# Patient Record
Sex: Male | Born: 1985 | Race: White | Hispanic: No | Marital: Single | Smoking: Current every day smoker
Health system: Southern US, Community
[De-identification: ages and names within clinical notes are randomized; demographics above are authoritative.]

## PROBLEM LIST (undated history)

## (undated) DIAGNOSIS — I498 Other specified cardiac arrhythmias: Secondary | ICD-10-CM

## (undated) DIAGNOSIS — R030 Elevated blood-pressure reading, without diagnosis of hypertension: Secondary | ICD-10-CM

## (undated) DIAGNOSIS — K649 Unspecified hemorrhoids: Secondary | ICD-10-CM

## (undated) DIAGNOSIS — J189 Pneumonia, unspecified organism: Secondary | ICD-10-CM

## (undated) DIAGNOSIS — K219 Gastro-esophageal reflux disease without esophagitis: Secondary | ICD-10-CM

## (undated) HISTORY — PX: WISDOM TOOTH EXTRACTION: SHX21

---
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
## 2012-12-24 DIAGNOSIS — J189 Pneumonia, unspecified organism: Secondary | ICD-10-CM

## 2012-12-24 HISTORY — DX: Pneumonia, unspecified organism: J18.9

## 2017-06-06 ENCOUNTER — Encounter: Payer: Self-pay | Admitting: Family Medicine

## 2017-06-06 ENCOUNTER — Encounter (INDEPENDENT_AMBULATORY_CARE_PROVIDER_SITE_OTHER): Payer: Self-pay

## 2017-06-06 ENCOUNTER — Ambulatory Visit (INDEPENDENT_AMBULATORY_CARE_PROVIDER_SITE_OTHER): Payer: No Typology Code available for payment source | Admitting: Family Medicine

## 2017-06-06 VITALS — BP 120/80 | HR 65 | Temp 98.2°F | Ht 73.0 in | Wt 186.6 lb

## 2017-06-06 DIAGNOSIS — K649 Unspecified hemorrhoids: Principal | ICD-10-CM

## 2017-06-06 DIAGNOSIS — R51 Headache: Secondary | ICD-10-CM

## 2017-06-06 DIAGNOSIS — D229 Melanocytic nevi, unspecified: Secondary | ICD-10-CM

## 2017-06-06 DIAGNOSIS — R002 Palpitations: Secondary | ICD-10-CM

## 2017-06-06 DIAGNOSIS — R519 Headache, unspecified: Secondary | ICD-10-CM

## 2017-06-06 LAB — COMPREHENSIVE METABOLIC PANEL
ALBUMIN: 4.8 g/dL (ref 3.5–5.2)
ALT: 27 U/L (ref 0–53)
AST: 22 U/L (ref 0–37)
Alkaline Phosphatase: 23 U/L — ABNORMAL LOW (ref 39–117)
BUN: 14 mg/dL (ref 6–23)
CHLORIDE: 104 meq/L (ref 96–112)
CO2: 26 mEq/L (ref 19–32)
Calcium: 10 mg/dL (ref 8.4–10.5)
Creatinine, Ser: 1.06 mg/dL (ref 0.40–1.50)
GFR: 86.59 mL/min (ref >60.00)
Glucose, Bld: 96 mg/dL (ref 70–99)
POTASSIUM: 4 meq/L (ref 3.5–5.1)
SODIUM: 137 meq/L (ref 135–145)
Total Bilirubin: 0.7 mg/dL (ref 0.2–1.2)
Total Protein: 7.4 g/dL (ref 6.0–8.3)

## 2017-06-06 LAB — CBC
HEMATOCRIT: 44.2 % (ref 39.0–52.0)
HEMOGLOBIN: 15.2 g/dL (ref 13.0–17.0)
MCHC: 34.3 g/dL (ref 30.0–36.0)
MCV: 89.1 fl (ref 78.0–100.0)
PLATELETS: 265 10*3/uL (ref 150.0–400.0)
RBC: 4.96 Mil/uL (ref 4.22–5.81)
RDW: 12.7 % (ref 11.5–15.5)
WBC: 6.6 10*3/uL (ref 4.0–10.5)

## 2017-06-06 LAB — TSH: TSH: 1.2 u[IU]/mL (ref 0.35–4.50)

## 2017-06-06 MED ORDER — HYDROCORTISONE 2.5 % RE CREA: 1.0000 "application " | TOPICAL_CREAM | Freq: Two times a day (BID) | RECTAL | 0 refills | Status: DC | PRN

## 2017-06-06 NOTE — Patient Instructions (Addendum)
Nice to meet you. We'll get you to see dermatology and Gen. Surgery. I would suggest seeing neurology as well given her description of the headaches. We will obtain lab work today as well. If you have recurrence of headaches and they do not improve quickly please be evaluated immediately.

## 2017-06-06 NOTE — Assessment & Plan Note (Addendum)
Patient with what sounds to be like primary headache related to sexual activity. Has not had any recurrence. He is neurologically intact. Given his headaches with sexual activity we will have him evaluated by neurology to determine if any imaging is necessary. Advised that if he has recurrent headaches that do not resolve quickly or if he develops any neurological symptoms he should be evaluated immediately.

## 2017-06-06 NOTE — Assessment & Plan Note (Signed)
Patient with extensive external hemorrhoids. No evidence of irritation at this time. We'll provide with Anusol to use when irritated. We'll refer to general surgery to evaluate for other treatments. We'll have him do stool cards as well.

## 2017-06-06 NOTE — Assessment & Plan Note (Signed)
Atypical appearing nevus on his back. We'll refer to dermatology for evaluation of this and full skin exam.

## 2017-06-06 NOTE — Assessment & Plan Note (Addendum)
Patient with intermittent increased heart rate. Suspect related to caffeine intake given that this has increased recently. We will obtain an EKG. EKG with incomplete right bundle-branch block. We'll forward to cardiology to review. We will evaluate with lab work as well. He'll decrease his caffeine intake.

## 2017-06-06 NOTE — Progress Notes (Signed)
Tommi Rumps, MD Phone: [phone]  Cory Ward is a 31 y.o. male who presents today for new patient visit.  Palpitations: Patient notes recently he has been able to feel his heart beat and notes it gets a little faster than usual. He has no chest pain or shortness of breath with this. He has equated it to caffeine intake though he only takes in 1 caffeinated beverage a day.  He reports issues with hemorrhoids over the last 2 years. It has become a daily issue with pain and itching and bleeding at times. He notes normal colored stools. Occasionally has bleeding on the tissue paper when he wipes. No blood in the toilet water.  Headaches: Patient has had 2 periods in his life where he has had headaches. A number of years ago he felt as though he would have spasm in his posterior head and neck. This would occur with exertion. This resolved after a number of months though a little over 6 months ago he developed significant headaches when he was having sexual activity. It would be really significant pain in the anterior portion of his head with orgasm. It would resolve within 30-60 minutes. He would intermittently take aspirin for this. He has not had any recurrence in the last 6 months. He has been physically and sexually active since then with no issues.  Patient notes a number of moles that he would like looked at. None of them have changed. He has had no new moles pop up.  Active Ambulatory Problems    Diagnosis Date Noted  . Hemorrhoids 06/06/2017  . Palpitations 06/06/2017  . 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 Nonintractable headache 06/06/2017   Resolved Ambulatory Problems    Diagnosis Date Noted  . No Resolved Ambulatory Problems   No Additional Past Medical History    Family History  Problem Relation Age of Onset  . Diabetes Maternal Grandmother   . Heart disease Maternal Grandfather     Social History   Social History  . Marital status: Single    Spouse name: N/A  .  Number of children: N/A  . Years of education: N/A   Occupational History  . Not on file.   Social History Main Topics  . Smoking status: Current Every Day Smoker    Packs/day: 0.25    Types: Cigarettes  . Smokeless tobacco: Never Used  . Alcohol use Yes  . Drug use: No  . Sexual activity: Not on file   Other Topics Concern  . Not on file   Social History Narrative  . No narrative on file    ROS  General:  Negative for nexplained weight loss, fever Skin: Negative for new or changing mole, sore that won't heal HEENT: Negative for trouble hearing, trouble seeing, ringing in ears, mouth sores, hoarseness, change in voice, dysphagia. CV:  Positive for palpitations, Negative for chest pain, dyspnea, edema Resp: Negative for cough, dyspnea, hemoptysis GI: Positive for rectal bleeding from hemorrhoids Negative for nausea, vomiting, diarrhea, constipation, abdominal pain, melena. GU: Negative for dysuria, incontinence, urinary hesitance, hematuria, vaginal or penile discharge, polyuria, sexual difficulty, lumps in testicle or breasts MSK: Negative for muscle cramps or aches, joint pain or swelling Neuro: Positive for headaches, negative for weakness, numbness, dizziness, passing out/fainting Psych: Negative for depression, anxiety, memory problems  Objective  Physical Exam Vitals:   06/06/17 1003  BP: 120/80  Pulse: 65  Temp: 98.2 F (36.8 C)    BP Readings from Last 3 Encounters:  06/06/17 120/80  Wt Readings from Last 3 Encounters:  06/06/17 186 lb 9.6 oz (84.6 kg)    Physical Exam  Constitutional: No distress.  HENT:  Head: Normocephalic and atraumatic.  Mouth/Throat: Oropharynx is clear and moist. No oropharyngeal exudate.  Eyes: Conjunctivae are normal. Pupils are equal, round, and reactive to light.  Cardiovascular: Normal rate, regular rhythm and normal heart sounds.   Pulmonary/Chest: Effort normal and breath sounds normal.  Abdominal: Soft. Bowel sounds  are normal. He exhibits no distension. There is no tenderness. There is no rebound and no guarding.  Genitourinary:  Genitourinary Comments: Extensive external hemorrhoids noted, none are irritated, thrombose, or inflamed, normal rectal exam otherwise, no stool in the rectal vault  Musculoskeletal: He exhibits no edema.  Neurological: He is alert. Gait normal.  CN 2-12 intact, 5/5 strength in bilateral biceps, triceps, grip, quads, hamstrings, plantar and dorsiflexion, sensation to light touch intact in bilateral UE and LE  Skin: Skin is warm and dry. He is not diaphoretic.  Scattered nevi on back, single nevus in mid thoracic area on the left just adjacent to the spine with slight irregularity of color, regular borders  Psychiatric: Mood and affect normal.   EKG: Normal sinus rhythm, rate 61, incomplete right bundle branch block  Assessment/Plan:   Hemorrhoids Patient with extensive external hemorrhoids. No evidence of irritation at this time. We'll provide with Anusol to use when irritated. We'll refer to general surgery to evaluate for other treatments. We'll have him do stool cards as well.  Palpitations Patient with intermittent increased heart rate. Suspect related to caffeine intake given that this has increased recently. We will obtain an EKG. EKG with incomplete right bundle-branch block. We'll forward to cardiology to review. We will evaluate with lab work as well. He'll decrease his caffeine intake.  Atypical nevus Atypical appearing nevus on his back. We'll refer to dermatology for evaluation of this and full skin exam.  Nonintractable headache Patient with what sounds to be like primary headache related to sexual activity. Has not had any recurrence. He is neurologically intact. Given his headaches with sexual activity we will have him evaluated by neurology to determine if any imaging is necessary. Advised that if he has recurrent headaches that do not resolve quickly or if he  develops any neurological symptoms he should be evaluated immediately.   Orders Placed This Encounter  Procedures  . Fecal occult blood, imunochemical    Standing Status:   Future    Standing Expiration Date:   06/06/2018  . Comp Met (CMET)  . CBC  . TSH  . Ambulatory referral to General Surgery    Referral Priority:   Routine    Referral Type:   Surgical    Referral Reason:   Specialty Services Required    Requested Specialty:   General Surgery    Number of Visits Requested:   1  . Ambulatory referral to Dermatology    Referral Priority:   Routine    Referral Type:   Consultation    Referral Reason:   Specialty Services Required    Requested Specialty:   Dermatology    Number of Visits Requested:   1  . Ambulatory referral to Neurology    Referral Priority:   Routine    Referral Type:   Consultation    Referral Reason:   Specialty Services Required    Requested Specialty:   Neurology    Number of Visits Requested:   1  . EKG 12-Lead    Meds  ordered this encounter  Medications  . hydrocortisone (ANUSOL-HC) 2.5 % rectal cream    Sig: Place 1 application rectally 2 (two) times daily as needed for hemorrhoids or itching.    Dispense:  30 g    Refill:  0     Tommi Rumps, MD [place]

## 2017-06-11 ENCOUNTER — Encounter: Payer: Self-pay | Admitting: *Deleted

## 2017-06-12 ENCOUNTER — Ambulatory Visit (INDEPENDENT_AMBULATORY_CARE_PROVIDER_SITE_OTHER): Payer: No Typology Code available for payment source | Admitting: General Surgery

## 2017-06-12 ENCOUNTER — Other Ambulatory Visit: Payer: Self-pay | Admitting: Family Medicine

## 2017-06-12 ENCOUNTER — Encounter: Payer: Self-pay | Admitting: General Surgery

## 2017-06-12 VITALS — BP 142/78 | HR 80 | Resp 12 | Ht 73.0 in | Wt 191.0 lb

## 2017-06-12 DIAGNOSIS — K644 Residual hemorrhoidal skin tags: Secondary | ICD-10-CM

## 2017-06-12 DIAGNOSIS — R002 Palpitations: Principal | ICD-10-CM

## 2017-06-12 DIAGNOSIS — K648 Other hemorrhoids: Principal | ICD-10-CM

## 2017-06-12 NOTE — Addendum Note (Signed)
Addended by: Christene Lye on: 06/12/2017 01:07 PM   Modules accepted: Orders, SmartSet

## 2017-06-12 NOTE — Patient Instructions (Addendum)
Recommend outpatient excision of internal hemorrhoids   Hemorrhoids Hemorrhoids are swollen veins in and around the rectum or anus. There are two types of hemorrhoids:  Internal hemorrhoids. These occur in the veins that are just inside the rectum. They may poke through to the outside and become irritated and painful.  External hemorrhoids. These occur in the veins that are outside of the anus and can be felt as a painful swelling or hard lump near the anus.  Most hemorrhoids do not cause serious problems, and they can be managed with home treatments such as diet and lifestyle changes. If home treatments do not help your symptoms, procedures can be done to shrink or remove the hemorrhoids. What are the causes? This condition is caused by increased pressure in the anal area. This pressure may result from various things, including:  Constipation.  Straining to have a bowel movement.  Diarrhea.  Pregnancy.  Obesity.  Sitting for long periods of time.  Heavy lifting or other activity that causes you to strain.  Anal sex.  What are the signs or symptoms? Symptoms of this condition include:  Pain.  Anal itching or irritation.  Rectal bleeding.  Leakage of stool (feces).  Anal swelling.  One or more lumps around the anus.  How is this diagnosed? This condition can often be diagnosed through a visual exam. Other exams or tests may also be done, such as:  Examination of the rectal area with a gloved hand (digital rectal exam).  Examination of the anal canal using a small tube (anoscope).  A blood test, if you have lost a significant amount of blood.  A test to look inside the colon (sigmoidoscopy or colonoscopy).  How is this treated? This condition can usually be treated at home. However, various procedures may be done if dietary changes, lifestyle changes, and other home treatments do not help your symptoms. These procedures can help make the hemorrhoids smaller or  remove them completely. Some of these procedures involve surgery, and others do not. Common procedures include:  Rubber band ligation. Rubber bands are placed at the base of the hemorrhoids to cut off the blood supply to them.  Sclerotherapy. Medicine is injected into the hemorrhoids to shrink them.  Infrared coagulation. A type of light energy is used to get rid of the hemorrhoids.  Hemorrhoidectomy surgery. The hemorrhoids are surgically removed, and the veins that supply them are tied off.  Stapled hemorrhoidopexy surgery. A circular stapling device is used to remove the hemorrhoids and use staples to cut off the blood supply to them.  Follow these instructions at home: Eating and drinking  Eat foods that have a lot of fiber in them, such as whole grains, beans, nuts, fruits, and vegetables. Ask your health care provider about taking products that have added fiber (fiber supplements).  Drink enough fluid to keep your urine clear or pale yellow. Managing pain and swelling  Take warm sitz baths for 20 minutes, 3-4 times a day to ease pain and discomfort.  If directed, apply ice to the affected area. Using ice packs between sitz baths may be helpful. ? Put ice in a plastic bag. ? Place a towel between your skin and the bag. ? Leave the ice on for 20 minutes, 2-3 times a day. General instructions  Take over-the-counter and prescription medicines only as told by your health care provider.  Use medicated creams or suppositories as told.  Exercise regularly.  Go to the bathroom when you have the urge  to have a bowel movement. Do not wait.  Avoid straining to have bowel movements.  Keep the anal area dry and clean. Use wet toilet paper or moist towelettes after a bowel movement.  Do not sit on the toilet for long periods of time. This increases blood pooling and pain. Contact a health care provider if:  You have increasing pain and swelling that are not controlled by treatment  or medicine.  You have uncontrolled bleeding.  You have difficulty having a bowel movement, or you are unable to have a bowel movement.  You have pain or inflammation outside the area of the hemorrhoids. This information is not intended to replace advice given to you by your health care provider. Make sure you discuss any questions you have with your health care provider. Document Released: 12/07/2000 Document Revised: 05/09/2016 Document Reviewed: 08/24/2015 Elsevier Interactive Patient Education  2017 [employer].

## 2017-06-12 NOTE — Progress Notes (Signed)
Patient ID: Cory Ward, male   DOB: [birth year], 31 y.o.   MRN: [id number]  Chief Complaint  Patient presents with  . Rectal Problems    hemorrhoids    HPI [facility] Cory Ward is a 31 y.o. male.  Here today for evaluation of hemorrhoids referred by Dr Cory Bis. Bowels move every other day. He states he does have some discomfort with BM's. He has noticed bright red blood with his bowel movements. He states this has been going on for several years. Last BM yesterday. Not using any rectal cream for relief. Primary symptom is bleeding. He denies any bowel changes, no abdominal symptoms HPI  Past Medical History:  Diagnosis Date  . Patient denies medical problems     Past Surgical History:  Procedure Laterality Date  . WISDOM TOOTH EXTRACTION      Family History  Problem Relation Age of Onset  . Diabetes Maternal Grandmother   . Heart disease Maternal Grandfather     Social History Social History  Substance Use Topics  . Smoking status: Current Every Day Smoker    Packs/day: 0.25    Types: Cigarettes  . Smokeless tobacco: Ward Used  . Alcohol use Yes    No Known Allergies  Current Outpatient Prescriptions  Medication Sig Dispense Refill  . hydrocortisone (ANUSOL-HC) 2.5 % rectal cream Place 1 application rectally 2 (two) times daily as needed for hemorrhoids or itching. (Patient not taking: Reported on 06/12/2017) 30 g 0   No current facility-administered medications for this visit.     Review of Systems Review of Systems  Constitutional: Negative.   Respiratory: Negative.   Cardiovascular: Negative.   Gastrointestinal: Positive for anal bleeding. Negative for constipation and diarrhea.    Blood pressure (!) 142/78, pulse 80, resp. rate 12, height 6\' 1"  (1.854 m), weight 191 lb (86.6 kg).  Physical Exam Physical Exam  Constitutional: He is oriented to person, place, and time. He appears well-developed and well-nourished.  HENT:  Mouth/Throat: Oropharynx is clear and  moist.  Eyes: Conjunctivae are normal. No scleral icterus.  Neck: Neck supple.  Cardiovascular: Normal rate, regular rhythm and normal heart sounds.   Pulmonary/Chest: Effort normal and breath sounds normal.  Abdominal: Soft. Normal appearance and bowel sounds are normal. There is no tenderness.  Genitourinary: Rectal exam shows external hemorrhoid and internal hemorrhoid.  Genitourinary Comments: multiple external hemorrhoids and multiple internal hemorrhoids the largest is right side. Also left side at 3 o'clock location  Lymphadenopathy:    He has no cervical adenopathy.  Neurological: He is alert and oriented to person, place, and time.  Skin: Skin is warm and dry.  Psychiatric: His behavior is normal.  Anoscopy was performed- multiple internal hemorrhoids , largest at 9 ocl, tend to bleed easily. He does have moderate increased sphincter tone but no fissure noted  Data Reviewed Notes reviewed.  Assessment     Multiple internal and external hemorrhoids. External ones are not inflamed or otherwise causing any symptoms. Bleeding is likely from multiple internal hemorrhoids.      Plan       Recommend  Exam under anesthesia and  excision of the multiple internal hemorrhoids internal hemorrhoids. Procedure, risks and benefits explained. Pt is agreeable.    HPI, Physical Exam, Assessment and Plan have been scribed under the direction and in the presence of Mckinley Jewel, MD  Karie Fetch, RN I have completed the exam and reviewed the above documentation for accuracy and completeness.  I agree with the above.  Haematologist has been used and any errors in dictation or transcription are unintentional.  Seeplaputhur G. Jamal Collin, M.D., F.A.C.S.   Junie Panning G 06/12/2017, 11:19 AM  Patient's surgery has been scheduled for 06-20-17 at [facility].   Dominga Ferry, CMA

## 2017-06-17 ENCOUNTER — Inpatient Hospital Stay
Admission: RE | Admit: 2017-06-17 | Discharge: 2017-06-17 | Disposition: A | Discharge status: Home / Self Care | Payer: No Typology Code available for payment source | Source: Ambulatory Visit

## 2017-06-18 ENCOUNTER — Inpatient Hospital Stay: Admission: RE | Admit: 2017-06-18 | Payer: No Typology Code available for payment source | Source: Ambulatory Visit

## 2017-06-19 ENCOUNTER — Encounter: Payer: Self-pay | Admitting: *Deleted

## 2017-06-19 NOTE — Progress Notes (Signed)
Per Caryl-Lyn, patient called the office yesterday to cancel exam under anesthesia and hemorrhoidectomy that was scheduled for 06-20-17.   This is due to costs associated with surgery.

## 2017-06-20 ENCOUNTER — Ambulatory Visit
Admission: RE | Admit: 2017-06-20 | Payer: No Typology Code available for payment source | Source: Ambulatory Visit | Admitting: General Surgery

## 2017-06-20 ENCOUNTER — Encounter: Admission: RE | Payer: Self-pay | Source: Ambulatory Visit

## 2017-06-20 SURGERY — HEMORRHOIDECTOMY
Anesthesia: Choice

## 2017-07-04 ENCOUNTER — Encounter: Payer: Self-pay | Admitting: Cardiovascular Disease

## 2017-07-04 ENCOUNTER — Ambulatory Visit (INDEPENDENT_AMBULATORY_CARE_PROVIDER_SITE_OTHER): Payer: No Typology Code available for payment source | Admitting: Cardiovascular Disease

## 2017-07-04 VITALS — BP 150/100 | HR 70 | Ht 73.0 in | Wt 187.2 lb

## 2017-07-04 DIAGNOSIS — R51 Headache: Secondary | ICD-10-CM

## 2017-07-04 DIAGNOSIS — R030 Elevated blood-pressure reading, without diagnosis of hypertension: Secondary | ICD-10-CM

## 2017-07-04 DIAGNOSIS — R002 Palpitations: Principal | ICD-10-CM

## 2017-07-04 DIAGNOSIS — F17200 Nicotine dependence, unspecified, uncomplicated: Secondary | ICD-10-CM

## 2017-07-04 DIAGNOSIS — R519 Headache, unspecified: Secondary | ICD-10-CM

## 2017-07-04 NOTE — Patient Instructions (Addendum)
Monitor BP Top number <140 Bottom <90   Medication Instructions:   No medication changes made  Call if you would like propranolol as needed 10 mg up to 20 mg last 4 to 6 hours  Or call for a monitor 2 day, 14 day, 30- day  Labwork:  No new labs needed  Testing/Procedures:  No further testing at this time   Follow-Up: It was a pleasure seeing you in the office today. Please call us if you have new issues that need to be addressed before your next appt.  [phone]  Your physician wants you to follow-up in:  As needed  If you need a refill on your cardiac medications before your next appointment, please call your pharmacy.          Steps to Quit Smoking Smoking tobacco can be bad for your health. It can also affect almost every organ in your body. Smoking puts you and people around you at risk for many serious long-lasting (chronic) diseases. Quitting smoking is hard, but it is one of the best things that you can do for your health. It is never too late to quit. What are the benefits of quitting smoking? When you quit smoking, you lower your risk for getting serious diseases and conditions. They can include:  Lung cancer or lung disease.  Heart disease.  Stroke.  Heart attack.  Not being able to have children (infertility).  Weak bones (osteoporosis) and broken bones (fractures).  If you have coughing, wheezing, and shortness of breath, those symptoms may get better when you quit. You may also get sick less often. If you are pregnant, quitting smoking can help to lower your chances of having a baby of low birth weight. What can I do to help me quit smoking? Talk with your doctor about what can help you quit smoking. Some things you can do (strategies) include:  Quitting smoking totally, instead of slowly cutting back how much you smoke over a period of time.  Going to in-person counseling. You are more likely to quit if you go to many counseling  sessions.  Using resources and support systems, such as: ? Database administrator with a Social worker. ? Phone quitlines. ? Careers information officer. ? Support groups or group counseling. ? Text messaging programs. ? Mobile phone apps or applications.  Taking medicines. Some of these medicines may have nicotine in them. If you are pregnant or breastfeeding, do not take any medicines to quit smoking unless your doctor says it is okay. Talk with your doctor about counseling or other things that can help you.  Talk with your doctor about using more than one strategy at the same time, such as taking medicines while you are also going to in-person counseling. This can help make quitting easier. What things can I do to make it easier to quit? Quitting smoking might feel very hard at first, but there is a lot that you can do to make it easier. Take these steps:  Talk to your family and friends. Ask them to support and encourage you.  Call phone quitlines, reach out to support groups, or work with a Social worker.  Ask people who smoke to not smoke around you.  Avoid places that make you want (trigger) to smoke, such as: ? Bars. ? Parties. ? Smoke-break areas at work.  Spend time with people who do not smoke.  Lower the stress in your life. Stress can make you want to smoke. Try these things to help your  stress: ? Getting regular exercise. ? Deep-breathing exercises. ? Yoga. ? Meditating. ? Doing a body scan. To do this, close your eyes, focus on one area of your body at a time from head to toe, and notice which parts of your body are tense. Try to relax the muscles in those areas.  Download or buy apps on your mobile phone or tablet that can help you stick to your quit plan. There are many free apps, such as QuitGuide from the [employer] Office manager for Disease Control and Prevention). You can find more support from smokefree.gov and other websites.  This information is not intended to replace advice given to  you by your health care provider. Make sure you discuss any questions you have with your health care provider. Document Released: 10/06/2009 Document Revised: 08/07/2016 Document Reviewed: 04/26/2015 Elsevier Interactive Patient Education  2018 [place] Risks of Smoking Smoking cigarettes is very bad for your health. Tobacco smoke has over 200 known poisons in it. It contains the poisonous gases nitrogen oxide and carbon monoxide. There are over 60 chemicals in tobacco smoke that cause cancer. Smoking is difficult to quit because a chemical in tobacco, called nicotine, causes addiction or dependence. When you smoke and inhale, nicotine is absorbed rapidly into the bloodstream through your lungs. Both inhaled and non-inhaled nicotine may be addictive. What are the risks of cigarette smoke? Cigarette smokers have an increased risk of many serious medical problems, including:  Lung cancer.  Lung disease, such as pneumonia, bronchitis, and emphysema.  Chest pain (angina) and heart attack because the heart is not getting enough oxygen.  Heart disease and peripheral blood vessel disease.  High blood pressure (hypertension).  Stroke.  Oral cancer, including cancer of the lip, mouth, or voice box.  Bladder cancer.  Pancreatic cancer.  Cervical cancer.  Pregnancy complications, including premature birth.  Stillbirths and smaller newborn babies, birth defects, and genetic damage to sperm.  Early menopause.  Lower estrogen level for women.  Infertility.  Facial wrinkles.  Blindness.  Increased risk of broken bones (fractures).  Senile dementia.  Stomach ulcers and internal bleeding.  Delayed wound healing and increased risk of complications during surgery.  Even smoking lightly shortens your life expectancy by several years.  Because of secondhand smoke exposure, children of smokers have an increased risk of the following:  Sudden infant death syndrome  (SIDS).  Respiratory infections.  Lung cancer.  Heart disease.  Ear infections.  What are the benefits of quitting? There are many health benefits of quitting smoking. Here are some of them:  Within days of quitting smoking, your risk of having a heart attack decreases, your blood flow improves, and your lung capacity improves. Blood pressure, pulse rate, and breathing patterns start returning to normal soon after quitting.  Within months, your lungs may clear up completely.  Quitting for 10 years reduces your risk of developing lung cancer and heart disease to almost that of a nonsmoker.  People who quit may see an improvement in their overall quality of life.  How do I quit smoking? Smoking is an addiction with both physical and psychological effects, and longtime habits can be hard to change. Your health care provider can recommend:  Programs and community resources, which may include group support, education, or talk therapy.  Prescription medicines to help reduce cravings.  Nicotine replacement products, such as patches, gum, and nasal sprays. Use these products only as directed. Do not replace cigarette smoking with electronic cigarettes, which are  commonly called e-cigarettes. The safety of e-cigarettes is not known, and some may contain harmful chemicals.  A combination of two or more of these methods.  Where to find more information:  American Lung Association: www.lung.org  American Cancer Society: www.cancer.org Summary  Smoking cigarettes is very bad for your health. Cigarette smokers have an increased risk of many serious medical problems, including several cancers, heart disease, and stroke.  Smoking is an addiction with both physical and psychological effects, and longtime habits can be hard to change.  By stopping right away, you can greatly reduce the risk of medical problems for you and your family.  To help you quit smoking, your health care provider  can recommend programs, community resources, prescription medicines, and nicotine replacement products such as patches, gum, and nasal sprays. This information is not intended to replace advice given to you by your health care provider. Make sure you discuss any questions you have with your health care provider. Document Released: 01/17/2005 Document Revised: 12/14/2016 Document Reviewed: 12/14/2016 Elsevier Interactive Patient Education  2017 [employer].

## 2017-07-04 NOTE — Progress Notes (Signed)
Cardiology Office Note  Date:  07/04/2017   ID:  [facility] [place], [place] [birth year], MRN [id number]  PCP:  Leone Haven, MD   Chief Complaint  Patient presents with  . other    New patient.Per Dr. Caryl Bis for palpitations. Meds reviewe dverbally with patient.     HPI:  [name] is a 30 year old gentleman with history of Smoking, one pack per week Chronic headache previously in the back of his head that presented with physical activity Symptoms resolved after starting aspirin More recently having pain in the front of his head during sexual activity History of HTN Recent symptoms of palpitations and fluttering,  fasciculations on the right side of his neck and right arm Who presents by referral by Dr. Caryl Bis for consultation of his palpitations and fasciculations and headache  In general reports he has been doing well,  Notes indicating previous blood pressures well controlled 120 over 80s Last several blood pressures documented 299 systolic up to 371 today He does not have a blood pressure cuff at home  Problems with palpitations, feels like a butterfly in his chest at times Also reports having fasciculations right side of his neck, right arm etiology unclear Denies any significant chest pain or shortness of breath with exertion Active at baseline  Troubled by recent pain in the front part of his head, worse with sexual activity  Able to stop the pain from getting worse by stopping the activity   He is scheduled to see neurology Order has been placed for MRA. This has been rescheduled  EKG personally reviewed by myself on todays visit Shows normal sinus rhythm with rate 70 bpm, no significant ST or T-wave changes    PMH:  Headache, hypertension, palpitations   PSH:    Past Surgical History:  Procedure Laterality Date  . WISDOM TOOTH EXTRACTION      Current Outpatient Prescriptions  Medication Sig Dispense Refill  . aspirin EC 81 MG tablet Take 81 mg by  mouth daily.     No current facility-administered medications for this visit.      Allergies:   Patient has no known allergies.   Social History:  The patient  reports that he has been smoking Cigarettes.  He has been smoking about 0.25 packs per day. He has never used smokeless tobacco. He reports that he drinks alcohol. He reports that he uses drugs, including Marijuana.   Family History:   family history includes Diabetes in his maternal grandmother; Heart disease in his maternal grandfather.    Review of Systems: Review of Systems  Constitutional: Negative.   Respiratory: Negative.   Cardiovascular: Negative.   Gastrointestinal: Negative.   Musculoskeletal: Negative.   Neurological: Negative.   Psychiatric/Behavioral: Negative.   All other systems reviewed and are negative.    PHYSICAL EXAM: VS:  BP (!) 150/100 (BP Location: Right Arm, Patient Position: Sitting, Cuff Size: Normal)   Pulse 70   Ht 6\' 1"  (1.854 m)   Wt 187 lb 4 oz (84.9 kg)   BMI 24.70 kg/m  , BMI Body mass index is 24.7 kg/m. GEN: Well nourished, well developed, in no acute distress  HEENT: normal  Neck: no JVD, carotid bruits, or masses Cardiac: RRR; no murmurs, rubs, or gallops,no edema  Respiratory:  clear to auscultation bilaterally, normal work of breathing GI: soft, nontender, nondistended, + BS MS: no deformity or atrophy  Skin: warm and dry, no rash Neuro:  Strength and sensation are intact Psych: euthymic mood, full affect  Recent Labs: 06/06/2017: ALT 27; BUN 14; Creatinine, Ser 1.06; Hemoglobin 15.2; Platelets 265.0; Potassium 4.0; Sodium 137; TSH 1.20    Lipid Panel No results found for: CHOL, HDL, LDLCALC, TRIG    Wt Readings from Last 3 Encounters:  07/04/17 187 lb 4 oz (84.9 kg)  06/12/17 191 lb (86.6 kg)  06/06/17 186 lb 9.6 oz (84.6 kg)       ASSESSMENT AND PLAN:  Palpitations - Plan: EKG 12-Lead Likely having APCs or PVCs Does not want medications such as beta  blockers at this time Seems to come in clusters, currently doing okay Recommended he all [place] if symptoms get worse and Holter or event monitor could be ordered We will typically start with beta blockers for symptom relief  Nonintractable headache, unspecified chronicity pattern, unspecified headache type Etiology of his headache is unclear, recommended he monitor blood pressure at home Elevated on today's visit  Elevated blood pressure reading Previous notes indicating relatively well-controlled pressure though elevated on today's visit Blood pressure guidelines provided to him  Smoker Smokes one pack per week We have encouraged him to continue to work on weaning his cigarettes and smoking cessation. He will continue to work on this and does not want any assistance with chantix.    Total encounter time more than 60 minutes  Greater than 50% was spent in counseling and coordination of care with the patient  Patient was seen in consultation for Dr. Caryl Bis and will be referred back to his office for ongoing care of the issues detailed above  Disposition:   F/U  as needed   Orders Placed This Encounter  Procedures  . EKG 12-Lead     Signed, Esmond Plants, M.D., Ph.D. 07/04/2017  [place], [place]

## 2017-09-03 ENCOUNTER — Ambulatory Visit: Payer: No Typology Code available for payment source | Admitting: Cardiovascular Disease

## 2017-09-06 ENCOUNTER — Ambulatory Visit: Payer: No Typology Code available for payment source | Admitting: Family Medicine

## 2017-09-09 ENCOUNTER — Encounter: Payer: Self-pay | Admitting: Family Medicine

## 2017-09-09 ENCOUNTER — Ambulatory Visit (INDEPENDENT_AMBULATORY_CARE_PROVIDER_SITE_OTHER): Payer: No Typology Code available for payment source | Admitting: Family Medicine

## 2017-09-09 DIAGNOSIS — R002 Palpitations: Secondary | ICD-10-CM

## 2017-09-09 DIAGNOSIS — R51 Headache: Secondary | ICD-10-CM

## 2017-09-09 DIAGNOSIS — K649 Unspecified hemorrhoids: Principal | ICD-10-CM

## 2017-09-09 DIAGNOSIS — R519 Headache, unspecified: Secondary | ICD-10-CM

## 2017-09-09 NOTE — Progress Notes (Signed)
  Tommi Rumps, MD Phone: [phone]  [facility] Weidinger is a 31 y.o. male who presents today for follow-up.  Palpitations: Patient has had no recurrence. He saw cardiology and they recommended monitoring. He notes no chest pain or shortness of breath.  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 He has not been able to afford this yet. Has not had any recurrence of the headache. He did note seeing a floater in his left eye visual field and saw ophthalmology and they noted it was a floater. He's not had any recurrent headaches.  The hemorrhoids seem to be the biggest issue currently. He has bleeding and discomfort most days. He saw Dr Jamal Collin who recommended surgical intervention. Patient noted he was advised he had internal hemorrhoids at that time. He does note prolapse on the days he has bowel movements. He will do sitz baths and abdominal exercises to get them to reduce. These have been beneficial over the last several years that he has been dealing with this. He does not reduce them on his own with his fingers. He has daily issues with his joints. Slight bleeding today. Discomfort today. He had a bowel movement today. He has been having bowel movement every other day and that has been beneficial.  PMH: Smoker   ROS see history of present illness  Objective  Physical Exam Vitals:   09/09/17 1600  BP: 128/88  Pulse: 68  Temp: 98.2 F (36.8 C)  SpO2: 98%    BP Readings from Last 3 Encounters:  09/09/17 128/88  07/04/17 (!) 150/100  06/12/17 (!) 142/78   Wt Readings from Last 3 Encounters:  09/09/17 182 lb 12.8 oz (82.9 kg)  07/04/17 187 lb 4 oz (84.9 kg)  06/12/17 191 lb (86.6 kg)    Physical Exam  Constitutional: No distress.  Cardiovascular: Normal rate, regular rhythm and normal heart sounds.   Pulmonary/Chest: Effort normal and breath sounds normal.  Genitourinary:  Genitourinary Comments: External hemorrhoids noted, no thrombosis noted, prolapsed  internal hemorrhoids noted, slightly tender, patient declines rectal exam and attempt at manual reduction  Neurological: He is alert. Gait normal.  Skin: Skin is warm and dry. He is not diaphoretic.     Assessment/Plan: Please see individual problem list.  Hemorrhoids Patient with internal and external hemorrhoids previously evaluated by general surgery and recommended surgical intervention. These have continued to bother him. He notes the creams in the past not been beneficial. Does have some bleeding today from his prolapsed internal hemorrhoids. Patient declined rectal exam. Discussed sitz baths at home and the abdominal exercise maneuvers that he has done previously to help them reduce. Advised on return precautions. Advised he needs contact the surgeon to consider the surgery.  Palpitations No recurrence. Monitor for recurrence.  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 Patient has not had any recurrence. Monitor for recurrence.  Tommi Rumps, MD [place]

## 2017-09-09 NOTE — Assessment & Plan Note (Signed)
Patient with internal and external hemorrhoids previously evaluated by general surgery and recommended surgical intervention. These have continued to bother him. He notes the creams in the past not been beneficial. Does have some bleeding today from his prolapsed internal hemorrhoids. Patient declined rectal exam. Discussed sitz baths at home and the abdominal exercise maneuvers that he has done previously to help them reduce. Advised on return precautions. Advised he needs contact the surgeon to consider the surgery.

## 2017-09-09 NOTE — Assessment & Plan Note (Signed)
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 Patient has not had any recurrence. Monitor for recurrence.

## 2017-09-09 NOTE — Patient Instructions (Signed)
Nice to see you. Please do your typical maneuvers to try to get your hemorrhoids reduced. I would suggest you see the surgeon for consideration of surgery. Please get the MRA done as well. If you develop excessive bleeding from your hemorrhoids or you develop persistent headache please be reevaluated.

## 2017-09-09 NOTE — Assessment & Plan Note (Signed)
No recurrence. Monitor for recurrence.

## 2017-09-16 ENCOUNTER — Encounter: Payer: Self-pay | Admitting: General Surgery

## 2017-09-16 ENCOUNTER — Ambulatory Visit (INDEPENDENT_AMBULATORY_CARE_PROVIDER_SITE_OTHER): Payer: No Typology Code available for payment source | Admitting: General Surgery

## 2017-09-16 VITALS — BP 138/70 | HR 70 | Resp 14 | Ht 73.0 in | Wt 183.0 lb

## 2017-09-16 DIAGNOSIS — K648 Other hemorrhoids: Principal | ICD-10-CM

## 2017-09-16 DIAGNOSIS — K644 Residual hemorrhoidal skin tags: Secondary | ICD-10-CM

## 2017-09-16 NOTE — Progress Notes (Signed)
Patient ID: [facility], male   DOB: [birth year], 31 y.o.   MRN: [id number]  Chief Complaint  Patient presents with  . Follow-up    Hemorrhoids    HPI [facility] Cory Ward is a 31 y.o. male here for reevaluation of hemorrhoids. He reports that last week he had on of his worse flair ups. He reports bleeding, swelling, and pain from these. He reports that he has a bowel movement every other day and this causes pain, bleeding, and swelling of his hemorrhoids. He was scheduled for internal hemorrhoid excision on 06/12/17 but cancelled due to financial issues. 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         HPI  Past Medical History:  Diagnosis Date  . Patient denies medical problems     Past Surgical History:  Procedure Laterality Date  . WISDOM TOOTH EXTRACTION      Family History  Problem Relation Age of Onset  . Diabetes Maternal Grandmother   . Heart disease Maternal Grandfather     Social History Social History  Substance Use Topics  . Smoking status: Current Every Day Smoker    Packs/day: 0.25    Types: Cigarettes  . Smokeless tobacco: Never Used     Comment: A pack will last him a week.   . Alcohol use Yes    No Known Allergies  No current outpatient prescriptions on file.   No current facility-administered medications for this visit.     Review of Systems Review of Systems  Constitutional: Negative.   Respiratory: Negative.   Cardiovascular: Negative.   Gastrointestinal: Positive for anal bleeding, blood in stool and rectal pain. Negative for abdominal distention, abdominal pain, constipation, diarrhea, nausea and vomiting.    Blood pressure 138/70, pulse 70, resp. rate 14, height 6\' 1"  (1.854 m), weight 183 lb (83 kg).  Physical Exam Physical Exam  Constitutional: He is oriented to person, place, and time. He appears well-developed and well-nourished.  Eyes: Conjunctivae are normal. No scleral icterus.  Neck: Neck supple.   Cardiovascular: Normal rate, regular rhythm and normal heart sounds.   Pulmonary/Chest: Effort normal and breath sounds normal.  Abdominal: Soft. Normal appearance and bowel sounds are normal.  Genitourinary: Rectal exam shows external hemorrhoid ( multiple) and internal hemorrhoid.     Lymphadenopathy:    He has no cervical adenopathy.  Neurological: He is alert and oriented to person, place, and time.  Skin: Skin is warm and dry.  Psychiatric: He has a normal mood and affect.    Data Reviewed Prior notes reviewed  Assessment    Internal and external hemorrhoids. Bleeding is main concern. Pt was scheduled for EUA and internal hemorrhoidectomy 3 mos ago- pt  had cancelled due to financial reasons. Same recommendation made and pt is agreeable.    Plan    Will schedule for internal hemorrhoidectomy and EUA. Counseled to continue applying topical creams to external hemorrhoids.     HPI, Physical Exam, Assessment and Plan have been scribed under the direction and in the presence of Cory Jewel, MD  Cory Living, LPN  I have completed the exam and reviewed the above documentation for accuracy and completeness.  I agree with the above.  Haematologist has been used and any errors in dictation or transcription are unintentional.  Cory Ward, M.D., F.A.C.S.   Cory Ward 09/16/2017, 4:25 PM   Patient's surgery has been scheduled for 09-23-17 at [facility].  Cory Ward, CMA

## 2017-09-16 NOTE — Patient Instructions (Addendum)
Surgical Procedures for Hemorrhoids Surgical procedures can be used to treat hemorrhoids. Hemorrhoids are swollen veins that are inside the rectum (internal hemorrhoids) or around the anus (external hemorrhoids). They are caused by increased pressure in the anal area. This pressure may result from straining to have a bowel movement (constipation), diarrhea, pregnancy, obesity, anal sex, or sitting for long periods of time. Hemorrhoids can cause symptoms such as pain and bleeding. Surgery may be needed if diet changes, lifestyle changes, and other treatments do not help your symptoms. Various surgical methods may be used. Three common methods are:  Closed hemorrhoidectomy. The hemorrhoids are surgically removed, and the surgical cuts (incisions) are closed with stitches (sutures).  Open hemorrhoidectomy. The hemorrhoids are surgically removed, but the incisions are allowed to heal without sutures.  Stapled hemorrhoidopexy. The hemorrhoids are removed using a device that takes out a ring of excess tissue.  Tell a health care provider about:  Any allergies you have.  All medicines you are taking, including vitamins, herbs, eye drops, creams, and over-the-counter medicines.  Any problems you or family members have had with anesthetic medicines.  Any blood disorders you have.  Any surgeries you have had.  Any medical conditions you have.  Whether you are pregnant or may be pregnant. What are the risks? Generally, this is a safe procedure. However, problems may occur, including:  Infection.  Bleeding.  Allergic reactions to medicines.  Damage to other structures or organs.  Pain.  Constipation.  Difficulty passing urine.  Narrowing of the anal canal (stenosis).  Difficulty controlling bowel movements (incontinence).  What happens before the procedure?  Ask your health care provider about: ? Changing or stopping your regular medicines. This is especially important if you are  taking diabetes medicines or blood thinners. ? Taking medicines such as aspirin and ibuprofen. These medicines can thin your blood. Do not take these medicines before your procedure if your health care provider instructs you not to.  You may need to have a procedure to examine the inside of your colon with a scope (colonoscopy). Your health care provider may do this to make sure that there are no other causes for your bleeding or pain.  Follow instructions from your health care provider about eating or drinking restrictions.  You may be instructed to take a laxative and an enema to clean out your colon before surgery (bowel prep). Carefully follow instructions from your health care provider about bowel prep.  Ask your health care provider how your surgical site will be marked or identified.  You may be given antibiotic medicine to help prevent infection.  Plan to have someone take you home after the procedure. What happens during the procedure?  To reduce your risk of infection: ? Your health care team will wash or sanitize their hands. ? Your skin will be washed with soap.  An IV tube will be inserted into one of your veins.  You will be given one or more of the following: ? A medicine to help you relax (sedative). ? A medicine to numb the area (local anesthetic). ? A medicine to make you fall asleep (general anesthetic). ? A medicine that is injected into an area of your body to numb everything below the injection site (regional anesthetic).  A lubricating jelly may be placed into your rectum.  Your surgeon will insert a short scope (anoscope) into your rectum to examine the hemorrhoids.  One of the following hemorrhoid procedures will be performed. Closed Hemorrhoidectomy  Your surgeon   will use surgical instruments to open the tissue around the hemorrhoids.  The veins that supply the hemorrhoids will be tied off with a suture.  The hemorrhoids will be removed.  The tissue  that surrounds the hemorrhoids will be closed with sutures that your body can absorb (absorbable sutures). Open Hemorrhoidectomy  The hemorrhoids will be removed with surgical instruments.  The incisions will be left open to heal without sutures. Stapled Hemorrhoidopexy  Your surgeon will use a circular stapling device to remove the hemorrhoids.  The device will be inserted into your anus. It will remove a circular ring of tissue that includes hemorrhoid tissue and some tissue above the hemorrhoids.  The staples in the device will close the edges of removed tissue. This will cut off the blood supply to the hemorrhoids and will pull any remaining hemorrhoids back into place. Each of these procedures may vary among health care providers and hospitals. What happens after the procedure?  Your blood pressure, heart rate, breathing rate, and blood oxygen level will be monitored often until the medicines you were given have worn off.  You will be given pain medicine as needed. This information is not intended to replace advice given to you by your health care provider. Make sure you discuss any questions you have with your health care provider. Document Released: 10/07/2009 Document Revised: 05/17/2016 Document Reviewed: 03/07/2015 Elsevier Interactive Patient Education  2018 [place]   May use cream for external hemorrhoids when they get inflammed. Cortizone or Preparation H.

## 2017-09-18 ENCOUNTER — Encounter
Admission: RE | Admit: 2017-09-18 | Discharge: 2017-09-18 | Disposition: A | Discharge status: Home / Self Care | Payer: No Typology Code available for payment source | Source: Ambulatory Visit | Attending: General Surgery | Admitting: General Surgery

## 2017-09-18 HISTORY — DX: Other specified cardiac arrhythmias: I49.8

## 2017-09-18 HISTORY — DX: Unspecified hemorrhoids: K64.9

## 2017-09-18 HISTORY — DX: Pneumonia, unspecified organism: J18.9

## 2017-09-18 NOTE — Patient Instructions (Signed)
Your procedure is scheduled on: 09-23-17  Report to Same Day Surgery 2nd floor medical mall [facility] Entrance-take elevator on left to 2nd floor.  Check in with surgery information desk.) To find out your arrival time please call [phone] between 1PM - 3PM on 09-20-17  Remember: Instructions that are not followed completely may result in serious medical risk, up to and including death, or upon the discretion of your surgeon and anesthesiologist your surgery may need to be rescheduled.    _x___ 1. Do not eat food after midnight the night before your procedure. You may drink clear liquids up to 2 hours before you are scheduled to arrive at the hospital for your procedure.  Do not drink clear liquids within 2 hours of your scheduled arrival to the hospital.  Clear liquids include  --Water or Apple juice without pulp  --Clear carbohydrate beverage such as ClearFast or Gatorade  --Black Coffee or Clear Tea (No milk, no creamers, do not add anything to  the coffee or Tea Type 1 and type 2 diabetics should only drink water.  No gum chewing or hard candies.     __x__ 2. No Alcohol for 24 hours before or after surgery.   __x__3. No Smoking for 24 prior to surgery.   ____  4. Bring all medications with you on the day of surgery if instructed.    __x__ 5. Notify your doctor if there is any change in your medical condition     (cold, fever, infections).     Do not wear jewelry, make-up, hairpins, clips or nail polish.  Do not wear lotions, powders, or perfumes. You may wear deodorant.  Do not shave 48 hours prior to surgery. Men may shave face and neck.  Do not bring valuables to the hospital.    [facility] is not responsible for any belongings or valuables.               Contacts, dentures or bridgework may not be worn into surgery.  Leave your suitcase in the car. After surgery it may be brought to your room.  For patients admitted to the hospital, discharge time is determined by  your treatment team.   Patients discharged the day of surgery will not be allowed to drive home.  You will need someone to drive you home and stay with you the night of your procedure.    Please read over the following fact sheets that you were given:   [facility] Preparing for Surgery and or MRSA Information   ____ Take anti-hypertensive listed below, cardiac, seizure, asthma,     anti-reflux and psychiatric medicines. These include:  1. NONE  2.  3.  4.  5.  6.  _X___Fleets enema or Magnesium Citrate as directed-DO FLEET ENEMA AT HOME 1 HOUR PRIOR TO [place]   ____ Use CHG Soap or sage wipes as directed on instruction sheet   ____ Use inhalers on the day of surgery and bring to hospital day of surgery  ____ Stop Metformin and Janumet 2 days prior to surgery.    ____ Take 1/2 of usual insulin dose the night before surgery and none on the morning     surgery.   _x___ Follow recommendations from Cardiologist, Pulmonologist or PCP regarding stopping Aspirin, Coumadin, Plavix ,Eliquis, Effient, or Pradaxa, and Pletal-PT CAN CONTINUE 81 MG ASA IF NEEDED-DO NOT TAKE AM OF SURGERY  X____Stop Anti-inflammatories such as Advil, Aleve, Ibuprofen, Motrin, Naproxen, Naprosyn, Goodies powders  or aspirin products-PT STOPPED IBUPROFEN LAST WEEK-OK to take Tylenol   ____ Stop supplements until after surgery.     ____ Bring C-Pap to the hospital.

## 2017-09-18 NOTE — Pre-Procedure Instructions (Signed)
Progress Notes Encounter Date: 07/04/2017 Cory Merritts, MD  Cardiology  Expand All Collapse All   [] Hide copied text [] Hover for attribution information Cardiology Office Note  Date:  07/04/2017   ID:  [facility] [name], [place] Cory Ward, MRN [id number]  PCP:  Cory Haven, MD                Chief Complaint  Patient presents with  . other    New patient.Per Cory Ward for palpitations. Meds reviewe dverbally with patient.     HPI:  Cory Ward is a 31 year old gentleman with history of Smoking, one pack per week Chronic headache previously in the back of his head that presented with physical activity Symptoms resolved after starting aspirin More recently having pain in the front of his head during sexual activity History of HTN Recent symptoms of palpitations and fluttering,  fasciculations on the right side of his neck and right arm Who presents by referral by Cory Ward for consultation of his palpitations and fasciculations and headache  In general reports he has been doing well,  Notes indicating previous blood pressures well controlled 120 over 80s Last several blood pressures documented 993 systolic up to 716 today He does not have a blood pressure cuff at home  Problems with palpitations, feels like a butterfly in his chest at times Also reports having fasciculations right side of his neck, right arm etiology unclear Denies any significant chest pain or shortness of breath with exertion Active at baseline  Troubled by recent pain in the front part of his head, worse with sexual activity  Able to stop the pain from getting worse by stopping the activity   He is scheduled to see neurology Order has been placed for MRA. This has been rescheduled  EKG personally reviewed by myself on todays visit Shows normal sinus rhythm with rate 70 bpm, no significant ST or T-wave changes    PMH:  Headache, hypertension, palpitations   PSH:           Past Surgical History:  Procedure Laterality Date  . WISDOM TOOTH EXTRACTION            Current Outpatient Prescriptions  Medication Sig Dispense Refill  . aspirin EC 81 MG tablet Take 81 mg by mouth daily.     No current facility-administered medications for this visit.      Allergies:   Patient has no known allergies.   Social History:  The patient  reports that he has been smoking Cigarettes.  He has been smoking about 0.25 packs per day. He has never used smokeless tobacco. He reports that he drinks alcohol. He reports that he uses drugs, including Marijuana.   Family History:   family history includes Diabetes in his maternal grandmother; Heart disease in his maternal grandfather.    Review of Systems: Review of Systems  Constitutional: Negative.   Respiratory: Negative.   Cardiovascular: Negative.   Gastrointestinal: Negative.   Musculoskeletal: Negative.   Neurological: Negative.   Psychiatric/Behavioral: Negative.   All other systems reviewed and are negative.    PHYSICAL EXAM: VS:  BP (!) 150/100 (BP Location: Right Arm, Patient Position: Sitting, Cuff Size: Normal)   Pulse 70   Ht 6\' 1"  (1.854 m)   Wt 187 lb 4 oz (84.9 kg)   BMI 24.70 kg/m  , BMI Body mass index is 24.7 kg/m. GEN: Well nourished, well developed, in no acute distress  HEENT: normal  Neck: no JVD, carotid bruits, or  masses Cardiac: RRR; no murmurs, rubs, or gallops,no edema  Respiratory:  clear to auscultation bilaterally, normal work of breathing GI: soft, nontender, nondistended, + BS MS: no deformity or atrophy  Skin: warm and dry, no rash Neuro:  Strength and sensation are intact Psych: euthymic mood, full affect    Recent Labs: 06/06/2017: ALT 27; BUN 14; Creatinine, Ser 1.06; Hemoglobin 15.2; Platelets 265.0; Potassium 4.0; Sodium 137; TSH 1.20    Lipid Panel RecentLabs  No results found for: CHOL, HDL, LDLCALC, TRIG         Wt Readings  from Last 3 Encounters:  07/04/17 187 lb 4 oz (84.9 kg)  06/12/17 191 lb (86.6 kg)  06/06/17 186 lb 9.6 oz (84.6 kg)       ASSESSMENT AND PLAN:  Palpitations - Plan: EKG 12-Lead Likely having APCs or PVCs Does not want medications such as beta blockers at this time Seems to come in clusters, currently doing okay Recommended he all [place] if symptoms get worse and Holter or event monitor could be ordered We will typically start with beta blockers for symptom relief  Nonintractable headache, unspecified chronicity pattern, unspecified headache type Etiology of his headache is unclear, recommended he monitor blood pressure at home Elevated on today's visit  Elevated blood pressure reading Previous notes indicating relatively well-controlled pressure though elevated on today's visit Blood pressure guidelines provided to him  Smoker Smokes one pack per week We have encouraged him to continue to work on weaning his cigarettes and smoking cessation. He will continue to work on this and does not want any assistance with chantix.    Total encounter time more than 60 minutes  Greater than 50% was spent in counseling and coordination of care with the patient  Patient was seen in consultation for Cory Ward and will be referred back to his office for ongoing care of the issues detailed above  Disposition:   F/U  as needed      Orders Placed This Encounter  Procedures  . EKG 12-Lead     Signed, Cory Ward, M.D., Ph.D. 07/04/2017  Cory Ward     Electronically signed by Cory Merritts, MD at 07/04/2017 7:11 PM      Office Visit on 07/04/2017        Detailed Report

## 2017-09-20 ENCOUNTER — Encounter: Payer: Self-pay | Admitting: *Deleted

## 2017-09-23 ENCOUNTER — Ambulatory Visit: Payer: No Typology Code available for payment source | Admitting: Anesthesiology

## 2017-09-23 ENCOUNTER — Encounter
Admission: RE | Disposition: A | Discharge status: Home / Self Care | Payer: Self-pay | Source: Ambulatory Visit | Attending: General Surgery

## 2017-09-23 ENCOUNTER — Encounter: Payer: Self-pay | Admitting: *Deleted

## 2017-09-23 ENCOUNTER — Ambulatory Visit
Admission: RE | Admit: 2017-09-23 | Discharge: 2017-09-23 | Disposition: A | Discharge status: Home / Self Care | Payer: No Typology Code available for payment source | Source: Ambulatory Visit | Attending: General Surgery | Admitting: General Surgery

## 2017-09-23 DIAGNOSIS — K644 Residual hemorrhoidal skin tags: Secondary | ICD-10-CM

## 2017-09-23 DIAGNOSIS — K648 Other hemorrhoids: Secondary | ICD-10-CM

## 2017-09-23 DIAGNOSIS — K625 Hemorrhage of anus and rectum: Secondary | ICD-10-CM

## 2017-09-23 DIAGNOSIS — F17210 Nicotine dependence, cigarettes, uncomplicated: Secondary | ICD-10-CM

## 2017-09-23 HISTORY — PX: RECTAL EXAM UNDER ANESTHESIA: SHX6399

## 2017-09-23 HISTORY — DX: Elevated blood-pressure reading, without diagnosis of hypertension: R03.0

## 2017-09-23 HISTORY — PX: HEMORRHOID SURGERY: SHX153

## 2017-09-23 LAB — URINE DRUG SCREEN, QUALITATIVE (ARMC ONLY)
Amphetamines, Ur Screen: NOT DETECTED
BARBITURATES, UR SCREEN: NOT DETECTED
BENZODIAZEPINE, UR SCRN: NOT DETECTED
CANNABINOID 50 NG, UR ~~LOC~~: POSITIVE — AB
Cocaine Metabolite,Ur ~~LOC~~: NOT DETECTED
MDMA (ECSTASY) UR SCREEN: NOT DETECTED
Methadone Scn, Ur: NOT DETECTED
Opiate, Ur Screen: NOT DETECTED
PHENCYCLIDINE (PCP) UR S: NOT DETECTED
TRICYCLIC, UR SCREEN: NOT DETECTED

## 2017-09-23 SURGERY — HEMORRHOIDECTOMY
Anesthesia: General | Wound class: Clean Contaminated

## 2017-09-23 MED ORDER — HYDRALAZINE HCL 20 MG/ML IJ SOLN
INTRAMUSCULAR | Status: AC
  Filled 2017-09-23: qty 1

## 2017-09-23 MED ORDER — FLEET ENEMA 7-19 GM/118ML RE ENEM: 1.0000 | ENEMA | Freq: Once | RECTAL | Status: DC

## 2017-09-23 MED ORDER — HYDRALAZINE HCL 20 MG/ML IJ SOLN
10.0000 mg | Freq: Once | INTRAMUSCULAR | Status: AC
  Administered 2017-09-23: 10 mg via INTRAVENOUS

## 2017-09-23 MED ORDER — PROPOFOL 10 MG/ML IV BOLUS
INTRAVENOUS | Status: DC | PRN
  Administered 2017-09-23: 200 mg via INTRAVENOUS

## 2017-09-23 MED ORDER — FENTANYL CITRATE (PF) 100 MCG/2ML IJ SOLN
INTRAMUSCULAR | Status: DC
Start: 2017-09-23 — End: 2017-09-23
  Filled 2017-09-23: qty 2

## 2017-09-23 MED ORDER — TRAMADOL HCL 50 MG PO TABS
50.0000 mg | ORAL_TABLET | Freq: Once | ORAL | Status: AC
  Administered 2017-09-23: 50 mg via ORAL

## 2017-09-23 MED ORDER — MIDAZOLAM HCL 2 MG/2ML IJ SOLN
INTRAMUSCULAR | Status: AC
  Filled 2017-09-23: qty 2

## 2017-09-23 MED ORDER — KETOROLAC TROMETHAMINE 30 MG/ML IJ SOLN
INTRAMUSCULAR | Status: DC | PRN
  Administered 2017-09-23: 30 mg via INTRAVENOUS

## 2017-09-23 MED ORDER — BUPIVACAINE HCL (PF) 0.5 % IJ SOLN
INTRAMUSCULAR | Status: DC | PRN
  Administered 2017-09-23: 30 mL

## 2017-09-23 MED ORDER — LIDOCAINE HCL (CARDIAC) 20 MG/ML IV SOLN
INTRAVENOUS | Status: DC | PRN
  Administered 2017-09-23: 80 mg via INTRAVENOUS

## 2017-09-23 MED ORDER — MIDAZOLAM HCL 2 MG/2ML IJ SOLN
INTRAMUSCULAR | Status: DC | PRN
  Administered 2017-09-23: 2 mg via INTRAVENOUS

## 2017-09-23 MED ORDER — FENTANYL CITRATE (PF) 100 MCG/2ML IJ SOLN
25.0000 ug | INTRAMUSCULAR | Status: DC | PRN
  Administered 2017-09-23: 25 ug via INTRAVENOUS
  Administered 2017-09-23 (×2): 50 ug via INTRAVENOUS
  Administered 2017-09-23: 25 ug via INTRAVENOUS

## 2017-09-23 MED ORDER — DEXAMETHASONE SODIUM PHOSPHATE 10 MG/ML IJ SOLN
INTRAMUSCULAR | Status: DC | PRN
Start: 2017-09-23 — End: 2017-09-23
  Administered 2017-09-23: 5 mg via INTRAVENOUS

## 2017-09-23 MED ORDER — TRAMADOL HCL 50 MG PO TABS: 50.0000 mg | ORAL_TABLET | Freq: Four times a day (QID) | ORAL | 0 refills | Status: DC | PRN

## 2017-09-23 MED ORDER — BUPIVACAINE LIPOSOME 1.3 % IJ SUSP
INTRAMUSCULAR | Status: AC
  Filled 2017-09-23: qty 20

## 2017-09-23 MED ORDER — PROMETHAZINE HCL 25 MG/ML IJ SOLN: 6.2500 mg | INTRAMUSCULAR | Status: DC | PRN

## 2017-09-23 MED ORDER — HYDROMORPHONE HCL 1 MG/ML IJ SOLN
0.2500 mg | INTRAMUSCULAR | Status: DC | PRN
  Administered 2017-09-23: 0.25 mg via INTRAVENOUS
  Administered 2017-09-23 (×2): 0.5 mg via INTRAVENOUS
  Administered 2017-09-23: 0.25 mg via INTRAVENOUS
  Administered 2017-09-23: 0.5 mg via INTRAVENOUS

## 2017-09-23 MED ORDER — BUPIVACAINE HCL (PF) 0.5 % IJ SOLN
INTRAMUSCULAR | Status: AC
  Filled 2017-09-23: qty 30

## 2017-09-23 MED ORDER — ACETAMINOPHEN 10 MG/ML IV SOLN
INTRAVENOUS | Status: DC | PRN
  Administered 2017-09-23: 1000 mg via INTRAVENOUS

## 2017-09-23 MED ORDER — FENTANYL CITRATE (PF) 100 MCG/2ML IJ SOLN
INTRAMUSCULAR | Status: DC | PRN
  Administered 2017-09-23 (×2): 50 ug via INTRAVENOUS

## 2017-09-23 MED ORDER — PROPOFOL 10 MG/ML IV BOLUS
INTRAVENOUS | Status: AC
  Filled 2017-09-23: qty 20

## 2017-09-23 MED ORDER — LACTATED RINGERS IV SOLN
INTRAVENOUS | Status: DC
  Administered 2017-09-23: 13:00:00 via INTRAVENOUS

## 2017-09-23 MED ORDER — FENTANYL CITRATE (PF) 100 MCG/2ML IJ SOLN
INTRAMUSCULAR | Status: AC
  Filled 2017-09-23: qty 2

## 2017-09-23 MED ORDER — FAMOTIDINE 20 MG PO TABS
ORAL_TABLET | ORAL | Status: AC
  Filled 2017-09-23: qty 1

## 2017-09-23 MED ORDER — TRAMADOL HCL 50 MG PO TABS
ORAL_TABLET | ORAL | Status: AC
  Administered 2017-09-23: 50 mg via ORAL
  Filled 2017-09-23: qty 1

## 2017-09-23 MED ORDER — HYDROMORPHONE HCL 1 MG/ML IJ SOLN
INTRAMUSCULAR | Status: AC
  Administered 2017-09-23: 0.5 mg via INTRAVENOUS
  Filled 2017-09-23: qty 1

## 2017-09-23 MED ORDER — HYDROMORPHONE HCL 1 MG/ML IJ SOLN
INTRAMUSCULAR | Status: AC
  Administered 2017-09-23: 0.25 mg via INTRAVENOUS
  Filled 2017-09-23: qty 1

## 2017-09-23 MED ORDER — ONDANSETRON HCL 4 MG/2ML IJ SOLN
INTRAMUSCULAR | Status: DC | PRN
  Administered 2017-09-23: 4 mg via INTRAVENOUS

## 2017-09-23 MED ORDER — FAMOTIDINE 20 MG PO TABS
20.0000 mg | ORAL_TABLET | Freq: Once | ORAL | Status: AC
  Administered 2017-09-23: 20 mg via ORAL

## 2017-09-23 MED ORDER — FENTANYL CITRATE (PF) 100 MCG/2ML IJ SOLN
INTRAMUSCULAR | Status: AC
  Administered 2017-09-23: 25 ug via INTRAVENOUS
  Filled 2017-09-23: qty 2

## 2017-09-23 MED ORDER — BACITRACIN ZINC 500 UNIT/GM EX OINT
TOPICAL_OINTMENT | CUTANEOUS | Status: AC
  Filled 2017-09-23: qty 28.35

## 2017-09-23 SURGICAL SUPPLY — 27 items
BLADE SURG 15 STRL SS SAFETY (BLADE) ×3 IMPLANT
BRIEF STRETCH MATERNITY 2XLG (MISCELLANEOUS) ×3 IMPLANT
CANISTER SUCT 1200ML W/VALVE (MISCELLANEOUS) ×3 IMPLANT
DRAPE LAPAROTOMY 77X122 PED (DRAPES) ×3 IMPLANT
DRAPE LEGGINS SURG 28X43 STRL (DRAPES) ×3 IMPLANT
DRAPE UNDER BUTTOCK W/FLU (DRAPES) ×3 IMPLANT
ELECT REM PT RETURN 9FT ADLT (ELECTROSURGICAL) ×3
ELECTRODE REM PT RTRN 9FT ADLT (ELECTROSURGICAL) ×2 IMPLANT
GLOVE BIO SURGEON STRL SZ7 (GLOVE) ×15 IMPLANT
GOWN STRL REUS W/ TWL LRG LVL3 (GOWN DISPOSABLE) ×6 IMPLANT
GOWN STRL REUS W/TWL LRG LVL3 (GOWN DISPOSABLE) ×3
KIT RM TURNOVER STRD PROC AR (KITS) ×3 IMPLANT
LABEL OR SOLS (LABEL) ×3 IMPLANT
LIGASURE IMPACT 36 18CM CVD LR (INSTRUMENTS) ×3 IMPLANT
NEEDLE HYPO 25X1 1.5 SAFETY (NEEDLE) ×3 IMPLANT
NS IRRIG 500ML POUR BTL (IV SOLUTION) ×3 IMPLANT
PACK BASIN MINOR ARMC (MISCELLANEOUS) ×3 IMPLANT
PAD OB MATERNITY 4.3X12.25 (PERSONAL CARE ITEMS) ×3 IMPLANT
PAD PREP 24X41 OB/GYN DISP (PERSONAL CARE ITEMS) ×3 IMPLANT
SOL PREP PVP 2OZ (MISCELLANEOUS) ×3
SOLUTION PREP PVP 2OZ (MISCELLANEOUS) ×2 IMPLANT
SURGILUBE 2OZ TUBE FLIPTOP (MISCELLANEOUS) ×3 IMPLANT
SUT MNCRL 3-0 UNDYED SH (SUTURE) ×2 IMPLANT
SUT MONOCRYL 3-0 UNDYED (SUTURE) ×1
SUT VIC AB 3-0 SH 27 (SUTURE) ×1
SUT VIC AB 3-0 SH 27X BRD (SUTURE) ×2 IMPLANT
SYR CONTROL 10ML (SYRINGE) ×3 IMPLANT

## 2017-09-23 NOTE — Op Note (Signed)
Preop diagnosis: Rectal bleeding with hemorrhoids  Post op diagnosis: Multiple internal hemorrhoids and external hemorrhoids  Operation: Rectal exam under anesthesia and internal hemorrhoidectomy 3 columns  Surgeon:S.G.Mikhala Kenan  Assistant:     Anesthesia: Gen.  Complications: None  EBL: Minimal  Drains: None  Description: Patient was put to sleep with an LMA and then placed the lithotomy position on the operating table. Anal area was prepped and draped as sterile field and timeout performed. Patient had long-standing symptoms of bleeding and discomfort. Most of this appeared to be predominantly related internal hemorrhoids. Exam at this time revealed that he had a multitude of external hemorrhoidal skin which were loose on uninvolved. Digital and speculum examination was then performed and noted the patient had the 3 internal hemorrhoids-the largest of the 7:00 location and slightly smaller ones at 11 and 3:00. With good exposure of the speculum all 3 internal hemorrhoids were taken down and removed with the use of the LigaSure device. Excised tissue was sent to pathology. No other abnormal findings within the anorectal area was noted. Hemostasis was intact. The anorectal region was covered with bacitracin ointment and a dressing was placed on the outside. Patient subsequently was returned recovery room stable condition

## 2017-09-23 NOTE — Anesthesia Post-op Follow-up Note (Signed)
Anesthesia QCDR form completed.        

## 2017-09-23 NOTE — Progress Notes (Signed)
Dr Kayleen Memos in to see pt  Aware heart rate range 39 to 70"s   No new orders   Pt stable

## 2017-09-23 NOTE — Transfer of Care (Signed)
Immediate Anesthesia Transfer of Care Note  Patient: [facility]  Procedure(s) Performed: HEMORRHOIDECTOMY (N/A ) RECTAL EXAM UNDER ANESTHESIA  Patient Location: PACU  Anesthesia Type:General  Level of Consciousness: sedated  Airway & Oxygen Therapy: Patient Spontanous Breathing and Patient connected to face mask oxygen  Post-op Assessment: Report given to RN and Post -op Vital signs reviewed and stable  Post vital signs: Reviewed and stable  Last Vitals:  Vitals:   09/23/17 1252  BP: 124/82  Pulse: 61  Resp: 16  Temp: 36.6 C  SpO2: 100%    Last Pain:  Vitals:   09/23/17 1252  TempSrc: Tympanic  PainSc: 6          Complications: No apparent anesthesia complications

## 2017-09-23 NOTE — Anesthesia Procedure Notes (Signed)
Procedure Name: LMA Insertion Date/Time: 09/23/2017 2:30 PM Performed by: Zetta Bills Pre-anesthesia Checklist: Patient identified, Emergency Drugs available, Suction available and Patient being monitored Patient Re-evaluated:Patient Re-evaluated prior to induction Oxygen Delivery Method: Circle system utilized Preoxygenation: Pre-oxygenation with 100% oxygen Induction Type: IV induction Ventilation: Mask ventilation without difficulty LMA: LMA inserted LMA Size: 5.0 Number of attempts: 1 Tube secured with: Tape Dental Injury: Teeth and Oropharynx as per pre-operative assessment

## 2017-09-23 NOTE — Interval H&P Note (Signed)
History and Physical Interval Note:  09/23/2017 2:07 PM  [place]  has presented today for surgery, with the diagnosis of hemorrhoids  The various methods of treatment have been discussed with the patient and family. After consideration of risks, benefits and other options for treatment, the patient has consented to  Procedure(s): HEMORRHOIDECTOMY (N/A) as a surgical intervention .  The patient's history has been reviewed, patient examined, no change in status, stable for surgery.  I have reviewed the patient's chart and labs.  Questions were answered to the patient's satisfaction.     SANKAR,SEEPLAPUTHUR G

## 2017-09-23 NOTE — H&P (View-Only) (Signed)
Patient ID: Cory Ward, male   DOB: 12/24/1986, 31 y.o.   MRN: [id number]  Chief Complaint  Patient presents with  . Follow-up    Hemorrhoids    HPI Cory Ward, Cory Ward is a 31 y.o. male here for reevaluation of hemorrhoids. He reports that last week he had on of his worse flair ups. He reports bleeding, swelling, and pain from these. He reports that he has a bowel movement every other day and this causes pain, bleeding, and swelling of his hemorrhoids. He was scheduled for internal hemorrhoid excision on 06/12/17 but cancelled due to financial issues. 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         HPI  Past Medical History:  Diagnosis Date  . Patient denies medical problems     Past Surgical History:  Procedure Laterality Date  . WISDOM TOOTH EXTRACTION      Family History  Problem Relation Age of Onset  . Diabetes Maternal Grandmother   . Heart disease Maternal Grandfather     Social History Social History  Substance Use Topics  . Smoking status: Current Every Day Smoker    Packs/day: 0.25    Types: Cigarettes  . Smokeless tobacco: Never Used     Comment: A pack will last him a week.   . Alcohol use Yes    No Known Allergies  No current outpatient prescriptions on file.   No current facility-administered medications for this visit.     Review of Systems Review of Systems  Constitutional: Negative.   Respiratory: Negative.   Cardiovascular: Negative.   Gastrointestinal: Positive for anal bleeding, blood in stool and rectal pain. Negative for abdominal distention, abdominal pain, constipation, diarrhea, nausea and vomiting.    Blood pressure 138/70, pulse 70, resp. rate 14, height 6\' 1"  (1.854 m), weight 183 lb (83 kg).  Physical Exam Physical Exam  Constitutional: He is oriented to person, place, and time. He appears well-developed and well-nourished.  Eyes: Conjunctivae are normal. No scleral icterus.  Neck: Neck supple.    Cardiovascular: Normal rate, regular rhythm and normal heart sounds.   Pulmonary/Chest: Effort normal and breath sounds normal.  Abdominal: Soft. Normal appearance and bowel sounds are normal.  Genitourinary: Rectal exam shows external hemorrhoid ( multiple) and internal hemorrhoid.     Lymphadenopathy:    He has no cervical adenopathy.  Neurological: He is alert and oriented to person, place, and time.  Skin: Skin is warm and dry.  Psychiatric: He has a normal mood and affect.    Data Reviewed Prior notes reviewed  Assessment    Internal and external hemorrhoids. Bleeding is main concern. Pt was scheduled for EUA and internal hemorrhoidectomy 3 mos ago- pt  had cancelled due to financial reasons. Same recommendation made and pt is agreeable.    Plan    Will schedule for internal hemorrhoidectomy and EUA. Counseled to continue applying topical creams to external hemorrhoids.     HPI, Physical Exam, Assessment and Plan have been scribed under the direction and in the presence of Mckinley Jewel, MD  [name], LPN  I have completed the exam and reviewed the above documentation for accuracy and completeness.  I agree with the above.  Haematologist has been used and any errors in dictation or transcription are unintentional.  Isais Klipfel G. Jamal Collin, M.D., F.A.C.S.   Junie Panning Darnell Level 09/16/2017, 4:25 PM   Patient's surgery has been scheduled for 09-23-17 at [facility].  Dominga Ferry, CMA

## 2017-09-23 NOTE — Progress Notes (Signed)
hydrazaline 10mg  given for blood pressure 133/101

## 2017-09-23 NOTE — Anesthesia Preprocedure Evaluation (Signed)
Anesthesia Evaluation  Patient identified by MRN, date of birth, ID band Patient awake    Reviewed: Allergy & Precautions, H&P , NPO status , Patient's Chart, lab work & pertinent test results, reviewed documented beta blocker date and time   History of Anesthesia Complications Negative for: history of anesthetic complications  Airway Mallampati: I  TM Distance: >3 FB Neck ROM: full    Dental  (+) Dental Advidsory Given, Teeth Intact, Chipped   Pulmonary neg shortness of breath, neg sleep apnea, neg COPD, neg recent URI, Current Smoker,           Cardiovascular Exercise Tolerance: Good (-) hypertension(-) angina(-) CAD, (-) Past MI, (-) Cardiac Stents and (-) CABG + dysrhythmias (-) Valvular Problems/Murmurs     Neuro/Psych negative neurological ROS  negative psych ROS   GI/Hepatic negative GI ROS, Neg liver ROS,   Endo/Other  negative endocrine ROS  Renal/GU negative Renal ROS  negative genitourinary   Musculoskeletal   Abdominal   Peds  Hematology negative hematology ROS (+)   Anesthesia Other Findings Past Medical History: No date: Elevated blood pressure reading No date: Fluttering heart     Comment:  SAW DR Rockey Situ ON 07-04-17-EKG DONE AND WAS WNL-PT TO F/U               IF NEEDED No date: Hemorrhoids 2014: Pneumonia   Reproductive/Obstetrics negative OB ROS                             Anesthesia Physical Anesthesia Plan  ASA: I  Anesthesia Plan: General   Post-op Pain Management:    Induction: Intravenous  PONV Risk Score and Plan: 1 and Ondansetron and Dexamethasone  Airway Management Planned: LMA  Additional Equipment:   Intra-op Plan:   Post-operative Plan: Extubation in OR  Informed Consent: I have reviewed the patients History and Physical, chart, labs and discussed the procedure including the risks, benefits and alternatives for the proposed anesthesia with the  patient or authorized representative who has indicated his/her understanding and acceptance.   Dental Advisory Given  Plan Discussed with: Anesthesiologist, CRNA and Surgeon  Anesthesia Plan Comments:         Anesthesia Quick Evaluation

## 2017-09-24 ENCOUNTER — Encounter: Payer: Self-pay | Admitting: General Surgery

## 2017-09-24 ENCOUNTER — Telehealth: Payer: Self-pay

## 2017-09-24 NOTE — Anesthesia Postprocedure Evaluation (Signed)
Anesthesia Post Note  Patient: [facility]  Procedure(s) Performed: HEMORRHOIDECTOMY (N/A ) RECTAL EXAM UNDER ANESTHESIA  Patient location during evaluation: PACU Anesthesia Type: General Level of consciousness: awake Pain management: pain level controlled Vital Signs Assessment: post-procedure vital signs reviewed and stable Respiratory status: spontaneous breathing Cardiovascular status: stable Anesthetic complications: no     Last Vitals:  Vitals:   09/23/17 1701 09/23/17 1723  BP: (!) 132/93 127/86  Pulse: (!) 56 61  Resp: 16   Temp: (!) 35.7 C   SpO2: 100% 100%    Last Pain:  Vitals:   09/23/17 1723  TempSrc:   PainSc: 3                  VAN STAVEREN,Kiing Deakin

## 2017-09-24 NOTE — Telephone Encounter (Signed)
Patient called complaining about bad pain after internal hemorrhoid surgery. He took his Tramadol this morning at 6 am. He states that he had a bowel movement around 7 am and since then he has been having a lot of pain inside. He took Two Tramadol at 8:30 am and has not had any relief from the pain, he also has been doing warm water soaks.  I spoke with Dr Jamal Collin and notified him of this and he gave instructions for the patient to use Preparation H cream to the area and to start taking Tylenol alternating with Advil every 6 hours. The patient was notified and will give this a try. He will call back tomorrow if no relief.

## 2017-09-25 LAB — SURGICAL PATHOLOGY

## 2017-09-30 ENCOUNTER — Encounter: Payer: Self-pay | Admitting: General Surgery

## 2017-09-30 ENCOUNTER — Ambulatory Visit (INDEPENDENT_AMBULATORY_CARE_PROVIDER_SITE_OTHER): Payer: No Typology Code available for payment source | Admitting: General Surgery

## 2017-09-30 VITALS — BP 114/78 | HR 78 | Resp 14 | Ht 73.0 in | Wt 183.0 lb

## 2017-09-30 DIAGNOSIS — K648 Other hemorrhoids: Principal | ICD-10-CM

## 2017-09-30 DIAGNOSIS — K644 Residual hemorrhoidal skin tags: Secondary | ICD-10-CM

## 2017-09-30 NOTE — Progress Notes (Signed)
Patient ID: [facility], male   DOB: 1986/03/25, 31 y.o.   MRN: [id number]  Chief Complaint  Patient presents with  . Routine Post Op    hemorrhoidectomy    HPI Cory Ward is a 31 y.o. male here for a post op internal hemorrhoidectomy done on 09/23/17. He reports that he is very sore. Some bleeding with bowel movements and it does cause pain. He is worse in the morning then is gets better through out the day. He has been using stool softeners and warm soaks. He states his employer has been understanding about his situation.  HPI  Past Medical History:  Diagnosis Date  . Elevated blood pressure reading   . Fluttering heart    SAW DR Cory Ward ON 07-04-17-EKG DONE AND WAS WNL-PT TO F/U IF NEEDED  . Hemorrhoids   . Pneumonia 2014    Past Surgical History:  Procedure Laterality Date  . HEMORRHOID SURGERY N/A 09/23/2017   Procedure: HEMORRHOIDECTOMY;  Surgeon: Cory Lye, MD;  Location: [facility] ORS;  Service: General;  Laterality: N/A;  . RECTAL EXAM UNDER ANESTHESIA  09/23/2017   Procedure: RECTAL EXAM UNDER ANESTHESIA;  Surgeon: Cory Lye, MD;  Location: [facility] ORS;  Service: General;;  . WISDOM TOOTH EXTRACTION      Family History  Problem Relation Age of Onset  . Diabetes Maternal Grandmother   . Heart disease Maternal Grandfather     Social History Social History  Substance Use Topics  . Smoking status: Current Every Day Smoker    Packs/day: 0.25    Types: Cigarettes  . Smokeless tobacco: Never Used     Comment: A pack will last him a week.   . Alcohol use Yes     Comment: 2-3 BEERS DAILY    No Known Allergies  Current Outpatient Prescriptions  Medication Sig Dispense Refill  . aspirin EC 81 MG tablet Take 81 mg by mouth as needed.    . docusate sodium (STOOL SOFTENER) 100 MG capsule Take 100 mg by mouth 2 (two) times daily.    Marland Kitchen ibuprofen (ADVIL,MOTRIN) 200 MG tablet Take 200-400 mg by mouth every 8 (eight) hours as needed for mild pain  (depends on pain if takes 1-2 tablets).    . phenylephrine-shark liver oil-mineral oil-petrolatum (PREPARATION H) 0.25-3-14-71.9 % rectal ointment Place 1 application rectally as needed for hemorrhoids.    . traMADol (ULTRAM) 50 MG tablet Take 1 tablet (50 mg total) by mouth every 6 (six) hours as needed. 20 tablet 0   No current facility-administered medications for this visit.     Review of Systems Review of Systems  Constitutional: Negative.   Respiratory: Negative.   Cardiovascular: Negative.     Blood pressure 114/78, pulse 78, resp. rate 14, height 6\' 1"  (1.854 m), weight 183 lb (83 kg).  Physical Exam Physical Exam  Constitutional: He is oriented to person, place, and time. He appears well-developed and well-nourished.  Abdominal: Soft. There is no tenderness.  Genitourinary:     Neurological: He is alert and oriented to person, place, and time.  Skin: Skin is warm and dry.  Psychiatric: He has a normal mood and affect. His behavior is normal.    Data Reviewed Prior notes and pathology reviewed  Assessment    Internal hemorrhoids s/p internal hemorrhoidectomy(3 columns) 09/23/17 - continue to use warm soaks. Healing well. Surgical pathology report on 3 excised internal hemorrhoids was benign and negative for malignancy.   External hemorrhoids - not thrombosed on  physical exam today. Per patient, pain is relieved with topical Preparation H and is being followed by PCP.    Plan    Return to clinic in 1 month for follow up. Continue to apply topical Preparation H to external hemorrhoids and do sitz baths for relief.    HPI, Physical Exam, Assessment and Plan have been scribed under the direction and in the presence of Cory Jewel, MD  Cory Living, LPN  I have completed the exam and reviewed the above documentation for accuracy and completeness.  I agree with the above.  Haematologist has been used and any errors in dictation or transcription are  unintentional.  Cory Ward, M.D., F.A.C.S.  Cory Ward 09/30/2017, 10:37 AM

## 2017-09-30 NOTE — Patient Instructions (Addendum)
Return in 1 month for follow up. . Continue to apply topical Preparation H to external hemorrhoids and do sitz baths for relief.

## 2017-10-28 ENCOUNTER — Encounter: Payer: Self-pay | Admitting: General Surgery

## 2017-10-28 ENCOUNTER — Ambulatory Visit (INDEPENDENT_AMBULATORY_CARE_PROVIDER_SITE_OTHER): Payer: No Typology Code available for payment source | Admitting: General Surgery

## 2017-10-28 VITALS — BP 130/80 | HR 78 | Resp 12 | Ht 73.0 in | Wt 187.0 lb

## 2017-10-28 DIAGNOSIS — K644 Residual hemorrhoidal skin tags: Principal | ICD-10-CM

## 2017-10-28 NOTE — Patient Instructions (Signed)
Return as  as needed.

## 2017-10-28 NOTE — Progress Notes (Signed)
Patient ID: Cory Ward, male   DOB: 1986/12/13, 31 y.o.   MRN: [id number]  No chief complaint on file.   HPI Cory Ward is a 31 y.o. male here today for his one month follow up hemorrhoidectomy done on 09/23/2017. Patient states he is doing well.  HPI  Past Medical History:  Diagnosis Date  . Elevated blood pressure reading   . Fluttering heart    SAW DR Cory Ward ON 07-04-17-EKG DONE AND WAS WNL-PT TO F/U IF NEEDED  . Hemorrhoids   . Pneumonia 2014    Past Surgical History:  Procedure Laterality Date  . WISDOM TOOTH EXTRACTION      Family History  Problem Relation Age of Onset  . Diabetes Maternal Grandmother   . Heart disease Maternal Grandfather     Social History Social History   Tobacco Use  . Smoking status: Current Every Day Smoker    Packs/day: 0.25    Types: Cigarettes  . Smokeless tobacco: Never Used  . Tobacco comment: A pack will last him a week.   Substance Use Topics  . Alcohol use: Yes    Comment: 2-3 BEERS DAILY  . Drug use: Yes    Types: Marijuana    Comment: DAILY    No Known Allergies  Current Outpatient Medications  Medication Sig Dispense Refill  . aspirin EC 81 MG tablet Take 81 mg by mouth as needed.    . docusate sodium (STOOL SOFTENER) 100 MG capsule Take 100 mg by mouth 2 (two) times daily.    Marland Kitchen ibuprofen (ADVIL,MOTRIN) 200 MG tablet Take 200-400 mg by mouth every 8 (eight) hours as needed for mild pain (depends on pain if takes 1-2 tablets).    . phenylephrine-shark liver oil-mineral oil-petrolatum (PREPARATION H) 0.25-3-14-71.9 % rectal ointment Place 1 application rectally as needed for hemorrhoids.    . traMADol (ULTRAM) 50 MG tablet Take 1 tablet (50 mg total) by mouth every 6 (six) hours as needed. 20 tablet 0   No current facility-administered medications for this visit.     Review of Systems Review of Systems  Blood pressure 130/80, pulse 78, resp. rate 12, height 6\' 1"  (1.854 m), weight 187 lb (84.8 kg).  Physical  Exam Physical Exam No prolapsing internal hemorrhoids seen. Row of uncomplicated external hemorrhoids remain. No tenderness. Data Reviewed Prior notes reviewed  Assessment    Post internal hemorhoidectomy, doing well.     Plan    Patient to return as needed. The patient is aware to call back for any questions or concerns.  HPI, Physical Exam, Assessment and Plan have been scribed under the direction and in the presence of Cory Jewel, MD  Cory Ward, CMA I have completed the exam and reviewed the above documentation for accuracy and completeness.  I agree with the above.  Haematologist has been used and any errors in dictation or transcription are unintentional.  Cory Ward Ward. Cory Ward, M.D., F.A.C.S.        Cory Ward 11/01/2017, 3:20 PM

## 2018-03-10 ENCOUNTER — Ambulatory Visit: Payer: No Typology Code available for payment source | Admitting: Family Medicine

## 2018-03-10 DIAGNOSIS — Z0289 Encounter for other administrative examinations: Secondary | ICD-10-CM

## 2018-07-02 ENCOUNTER — Telehealth: Payer: Self-pay | Admitting: Family Medicine

## 2018-07-02 NOTE — Telephone Encounter (Unsigned)
Copied from [place] [id number]. Topic: Referral - Request >> Jul 02, 2018  9:20 AM Cory Ward wrote: Reason for CRM: pt called in and stated that he was in [facility] and fractured his elbow.  He was told at the urgent care that he would need Ward ortho DR.  He would like Ward referral to one in his area as soon as possible.    Best number [phone]

## 2019-08-16 ENCOUNTER — Emergency Department: Payer: Self-pay

## 2019-08-16 ENCOUNTER — Inpatient Hospital Stay
Admission: EM | Admit: 2019-08-16 | Discharge: 2019-08-18 | DRG: 494 | Disposition: A | Discharge status: Home Health | Payer: Self-pay | Attending: Orthopedic Surgery | Admitting: Orthopedic Surgery

## 2019-08-16 ENCOUNTER — Encounter: Payer: Self-pay | Admitting: Emergency Medicine

## 2019-08-16 ENCOUNTER — Other Ambulatory Visit: Payer: Self-pay

## 2019-08-16 DIAGNOSIS — S82209A Unspecified fracture of shaft of unspecified tibia, initial encounter for closed fracture: Secondary | ICD-10-CM | POA: Diagnosis present

## 2019-08-16 DIAGNOSIS — S82231A Displaced oblique fracture of shaft of right tibia, initial encounter for closed fracture: Principal | ICD-10-CM | POA: Diagnosis present

## 2019-08-16 DIAGNOSIS — Z7982 Long term (current) use of aspirin: Secondary | ICD-10-CM

## 2019-08-16 DIAGNOSIS — Z79899 Other long term (current) drug therapy: Secondary | ICD-10-CM

## 2019-08-16 DIAGNOSIS — F17210 Nicotine dependence, cigarettes, uncomplicated: Secondary | ICD-10-CM | POA: Diagnosis present

## 2019-08-16 DIAGNOSIS — Y9351 Activity, roller skating (inline) and skateboarding: Secondary | ICD-10-CM

## 2019-08-16 DIAGNOSIS — S82431A Displaced oblique fracture of shaft of right fibula, initial encounter for closed fracture: Secondary | ICD-10-CM | POA: Diagnosis present

## 2019-08-16 DIAGNOSIS — Z419 Encounter for procedure for purposes other than remedying health state, unspecified: Secondary | ICD-10-CM

## 2019-08-16 DIAGNOSIS — S82831A Other fracture of upper and lower end of right fibula, initial encounter for closed fracture: Secondary | ICD-10-CM

## 2019-08-16 DIAGNOSIS — Z20828 Contact with and (suspected) exposure to other viral communicable diseases: Secondary | ICD-10-CM | POA: Diagnosis present

## 2019-08-16 DIAGNOSIS — S82391A Other fracture of lower end of right tibia, initial encounter for closed fracture: Secondary | ICD-10-CM

## 2019-08-16 LAB — CBC
HCT: 42.8 % (ref 39.0–52.0)
Hemoglobin: 14.9 g/dL (ref 13.0–17.0)
MCH: 31.1 pg (ref 26.0–34.0)
MCHC: 34.8 g/dL (ref 30.0–36.0)
MCV: 89.4 fL (ref 80.0–100.0)
Platelets: 292 10*3/uL (ref 150–400)
RBC: 4.79 MIL/uL (ref 4.22–5.81)
RDW: 11.6 % (ref 11.5–15.5)
WBC: 7.7 10*3/uL (ref 4.0–10.5)
nRBC: 0 % (ref 0.0–0.2)

## 2019-08-16 LAB — COMPREHENSIVE METABOLIC PANEL
ALT: 40 U/L (ref 0–44)
AST: 26 U/L (ref 15–41)
Albumin: 4.7 g/dL (ref 3.5–5.0)
Alkaline Phosphatase: 22 U/L — ABNORMAL LOW (ref 38–126)
Anion gap: 13 (ref 5–15)
BUN: 14 mg/dL (ref 6–20)
CO2: 22 mmol/L (ref 22–32)
Calcium: 9.3 mg/dL (ref 8.9–10.3)
Chloride: 105 mmol/L (ref 98–111)
Creatinine, Ser: 1.17 mg/dL (ref 0.61–1.24)
GFR calc Af Amer: >60 mL/min (ref >60)
GFR calc non Af Amer: >60 mL/min (ref >60)
Glucose, Bld: 109 mg/dL — ABNORMAL HIGH (ref 70–99)
Potassium: 3.8 mmol/L (ref 3.5–5.1)
Sodium: 140 mmol/L (ref 135–145)
Total Bilirubin: 1.2 mg/dL (ref 0.3–1.2)
Total Protein: 7.7 g/dL (ref 6.5–8.1)

## 2019-08-16 LAB — SARS CORONAVIRUS 2 BY RT PCR (HOSPITAL ORDER, PERFORMED IN ~~LOC~~ HOSPITAL LAB): SARS Coronavirus 2: NEGATIVE

## 2019-08-16 LAB — PROTIME-INR
INR: 1 (ref 0.8–1.2)
Prothrombin Time: 13.1 seconds (ref 11.4–15.2)

## 2019-08-16 LAB — APTT: aPTT: 25 seconds (ref 24–36)

## 2019-08-16 MED ORDER — HYDROMORPHONE HCL 1 MG/ML IJ SOLN
INTRAMUSCULAR | Status: AC
  Administered 2019-08-16: 1 mg via INTRAVENOUS
  Filled 2019-08-16: qty 1

## 2019-08-16 MED ORDER — HYDROMORPHONE HCL 1 MG/ML IJ SOLN
INTRAMUSCULAR | Status: AC
  Filled 2019-08-16: qty 1

## 2019-08-16 MED ORDER — HYDROMORPHONE HCL 1 MG/ML IJ SOLN
1.0000 mg | Freq: Once | INTRAMUSCULAR | Status: AC
  Administered 2019-08-16: 1 mg via INTRAVENOUS

## 2019-08-16 NOTE — ED Triage Notes (Signed)
Patient presents to Emergency Department via POV from homewith complaints of left lower leg pain.  Pt reports roller-blading and fell with right lateral twisting of right lower leg. Pt reports extreme pain to same area,  CMS intact to right toes.

## 2019-08-16 NOTE — ED Provider Notes (Addendum)
[facility] Emergency Department Provider Note   ____________________________________________    I have reviewed the triage vital signs and the nursing notes.   HISTORY  Chief Complaint Ankle Injury     HPI Cory Ward is a 33 y.o. male who presents with lower extremity injury.  Patient reports he was rollerblading and fell and has injured his right ankle and right knee.  He denies other injuries.  This occurred just prior to arrival.  He did arrive via EMS.  Has not received any pain medication.  No abdominal pain no chest wall pain.  Past Medical History:  Diagnosis Date  . Elevated blood pressure reading   . Fluttering heart    SAW DR Rockey Situ ON 07-04-17-EKG DONE AND WAS WNL-PT TO F/U IF NEEDED  . Hemorrhoids   . Pneumonia 2014    Patient Active Problem List   Diagnosis Date Noted  . Smoker 07/04/2017  . Hemorrhoids 06/06/2017  . Palpitations 06/06/2017  . 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 Nonintractable headache 06/06/2017    Past Surgical History:  Procedure Laterality Date  . HEMORRHOID SURGERY N/A 09/23/2017   Procedure: HEMORRHOIDECTOMY;  Surgeon: Christene Lye, MD;  Location: [facility] ORS;  Service: General;  Laterality: N/A;  . RECTAL EXAM UNDER ANESTHESIA  09/23/2017   Procedure: RECTAL EXAM UNDER ANESTHESIA;  Surgeon: Christene Lye, MD;  Location: [facility] ORS;  Service: General;;  . WISDOM TOOTH EXTRACTION      Prior to Admission medications   Medication Sig Start Date End Date Taking? Authorizing Provider  aspirin EC 81 MG tablet Take 81 mg by mouth as needed.    [provider]  docusate sodium (STOOL SOFTENER) 100 MG capsule Take 100 mg by mouth 2 (two) times daily.    [provider]  ibuprofen (ADVIL,MOTRIN) 200 MG tablet Take 200-400 mg by mouth every 8 (eight) hours as needed for mild pain (depends on pain if takes 1-2 tablets).    [provider]  phenylephrine-shark liver  oil-mineral oil-petrolatum (PREPARATION H) 0.25-3-14-71.9 % rectal ointment Place 1 application rectally as needed for hemorrhoids.    [provider]  traMADol (ULTRAM) 50 MG tablet Take 1 tablet (50 mg total) by mouth every 6 (six) hours as needed. 09/23/17   Christene Lye, MD     Allergies Patient has no known allergies.  Family History  Problem Relation Age of Onset  . Diabetes Maternal Grandmother   . Heart disease Maternal Grandfather     Social History Social History   Tobacco Use  . Smoking status: Current Every Day Smoker    Packs/day: 0.25    Types: Cigarettes  . Smokeless tobacco: Never Used  . Tobacco comment: A pack will last him a week.   Substance Use Topics  . Alcohol use: Yes    Comment: 2-3 BEERS DAILY  . Drug use: Yes    Types: Marijuana    Comment: DAILY    Review of Systems  Constitutional: No dizziness Eyes: No visual changes.  ENT: No neck pain Cardiovascular: Denies chest pain. Respiratory: Denies shortness of breath. Gastrointestinal: No abdominal pain.    Genitourinary: Negative for dysuria. Musculoskeletal: As above Skin: Negative for rash. Neurological: Negative for headaches or weakness   ____________________________________________   PHYSICAL EXAM:  VITAL SIGNS: ED Triage Vitals  Enc Vitals Group     BP 08/16/19 1948 (!) 146/100     Pulse Rate 08/16/19 1948 84     Resp 08/16/19  1948 (!) 22     Temp 08/16/19 1948 98.5 F (36.9 C)     Temp Source 08/16/19 1948 Oral     SpO2 08/16/19 1948 100 %     Weight 08/16/19 1950 86.2 kg (190 lb)     Height 08/16/19 1950 1.854 m (6\' 1" )     Head Circumference --      Peak Flow --      Pain Score 08/16/19 1942 10     Pain Loc --      Pain Edu? --      Excl. in [place]? --     Constitutional: Alert and oriented.  Eyes: Conjunctivae are normal.  Head: Atraumatic. Nose: No congestion/rhinnorhea. Mouth/Throat: Mucous membranes are moist.   Neck:  Painless ROM  Cardiovascular: Normal rate, regular rhythm.   Good peripheral circulation. Respiratory: Normal respiratory effort.  No retractions.  Gastrointestinal: Soft and nontender. No distention.   Genitourinary: deferred Musculoskeletal: Obvious fracture.  Patient's right foot is angled at nearly 90 degrees to his knee, normal cap refill, 2+ DP pulse Neurologic:  Normal speech and language. No gross focal neurologic deficits are appreciated.  Skin:  Skin is warm, dry and intact. No rash noted. Psychiatric: Mood and affect are normal. Speech and behavior are normal.  ____________________________________________   LABS (all labs ordered are listed, but only abnormal results are displayed)  Labs Reviewed  COMPREHENSIVE METABOLIC PANEL - Abnormal; Notable for the following components:      Result Value   Glucose, Bld 109 (*)    Alkaline Phosphatase 22 (*)    All other components within normal limits  SARS CORONAVIRUS 2 (HOSPITAL ORDER, [facility])  CBC  APTT  PROTIME-INR   ____________________________________________  EKG  ED ECG REPORT I, Lavonia Drafts, the attending physician, personally viewed and interpreted this ECG.  Date: 08/16/2019  Rhythm: normal sinus rhythm QRS Axis: normal Intervals: normal ST/T Wave abnormalities: normal Narrative Interpretation: no evidence of acute ischemia  ____________________________________________  RADIOLOGY  Patient with distal tibial fracture, proximal fibular fracture ____________________________________________   PROCEDURES  Procedure(s) performed: No  .Ortho Injury Treatment  Date/Time: 08/16/2019 9:10 PM Performed by: Lavonia Drafts, MD Authorized by: Lavonia Drafts, MD   Consent:    Consent obtained:  Verbal   Consent given by:  Patient   Risks discussed:  Fracture, recurrent dislocation, nerve damage and vascular damage   Alternatives discussed:  No treatmentInjury location: lower leg Location  details: right lower leg Injury type: fracture Fracture type: tibial shaft Pre-procedure neurovascular assessment: neurovascularly intact Pre-procedure distal perfusion: normal Pre-procedure neurological function: normal Pre-procedure range of motion: normal  Anesthesia: Local anesthesia used: no  Patient sedated: NoManipulation performed: yes Skin traction used: no Skeletal traction used: no Reduction successful: yes Immobilization: splint Splint type: short leg Supplies used: Ortho-Glass Post-procedure neurovascular assessment: post-procedure neurovascularly intact Post-procedure distal perfusion: normal Post-procedure neurological function: normal Post-procedure range of motion: normal Patient tolerance: patient tolerated the procedure well with no immediate complications      Critical Care performed: No ____________________________________________   INITIAL IMPRESSION / ASSESSMENT AND PLAN / ED COURSE  Pertinent labs & imaging results that were available during my care of the patient were reviewed by me and considered in my medical decision making (see chart for details).  Patient with fractures as described above, pain treated with IV Dilaudid x2.  Dr. Posey Pronto will admit the patient for surgery has asked for CT scan.  N.p.o. after midnight  ____________________________________________   FINAL CLINICAL IMPRESSION(S) / ED DIAGNOSES  Final diagnoses:  Other closed fracture of distal end of right tibia, initial encounter  Closed fracture of proximal end of right fibula, unspecified fracture morphology, initial encounter        Note:  This document was prepared using Dragon voice recognition software and may include unintentional dictation errors.   Lavonia Drafts, MD 08/16/19 2112    Lavonia Drafts, MD 08/16/19 2112    Lavonia Drafts, MD 08/16/19 2133

## 2019-08-16 NOTE — ED Notes (Signed)
ED TO INPATIENT HANDOFF REPORT  ED Nurse Name and Phone #:  Cory Ward Name/Age/Gender [facility] 33 y.o. male Room/Bed: ED02A/ED02A  Code Status   Code Status: Not on file  Home/SNF/Other Home Patient oriented to: self, place, time and situation Is this baseline? Yes   Triage Complete: Triage complete  Chief Complaint R Ankle Inj  Triage Note Patient presents to Emergency Department via POV from homewith complaints of left lower leg pain.  Pt reports roller-blading and fell with right lateral twisting of right lower leg. Pt reports extreme pain to same area,  CMS intact to right toes.       Allergies No Known Allergies  Level of Care/Admitting Diagnosis ED Disposition    ED Disposition Condition [facility] Area: [place] [[id number]]  Level of Care: Med-Surg [16]  Covid Evaluation: Asymptomatic Screening Protocol (No Symptoms)  Diagnosis: Tibia fracture [id number]  Admitting Physician: Leim Fabry [id number]  Attending Physician: Leim Fabry [id number]  Estimated length of stay: past midnight tomorrow  Certification:: I certify this patient will need inpatient services for at least 2 midnights  PT Class (Do Not Modify): Inpatient [101]  PT Acc Code (Do Not Modify): Private [1]       B Medical/Surgery History Past Medical History:  Diagnosis Date  . Elevated blood pressure reading   . Fluttering heart    SAW DR Rockey Situ ON 07-04-17-EKG DONE AND WAS WNL-PT TO F/U IF NEEDED  . Hemorrhoids   . Pneumonia 2014   Past Surgical History:  Procedure Laterality Date  . HEMORRHOID SURGERY N/A 09/23/2017   Procedure: HEMORRHOIDECTOMY;  Surgeon: Christene Lye, MD;  Location: [facility] ORS;  Service: General;  Laterality: N/A;  . RECTAL EXAM UNDER ANESTHESIA  09/23/2017   Procedure: RECTAL EXAM UNDER ANESTHESIA;  Surgeon: Christene Lye, MD;  Location: [facility] ORS;  Service: General;;  . WISDOM TOOTH EXTRACTION       A IV  Location/Drains/Wounds Patient Lines/Drains/Airways Status   Active Line/Drains/Airways    Name:   Placement date:   Placement time:   Site:   Days:   Peripheral IV 08/16/19 Left Antecubital   08/16/19    1940    Antecubital   less than 1   Incision (Closed) 09/23/17 Rectum   09/23/17    1451     692          Intake/Output Last 24 hours No intake or output data in the 24 hours ending 08/16/19 2312  Labs/Imaging Results for orders placed or performed during the hospital encounter of 08/16/19 (from the past 48 hour(s))  CBC     Status: None   Collection Time: 08/16/19  7:41 PM  Result Value Ref Range   WBC 7.7 4.0 - 10.5 K/uL   RBC 4.79 4.22 - 5.81 MIL/uL   Hemoglobin 14.9 13.0 - 17.0 g/dL   HCT 42.8 39.0 - 52.0 %   MCV 89.4 80.0 - 100.0 fL   MCH 31.1 26.0 - 34.0 pg   MCHC 34.8 30.0 - 36.0 g/dL   RDW 11.6 11.5 - 15.5 %   Platelets 292 150 - 400 K/uL   nRBC 0.0 0.0 - 0.2 %    Comment: Performed at [facility], [address] [place], [place] [address]  Comprehensive metabolic panel     Status: Abnormal   Collection Time: 08/16/19  7:41 PM  Result Value Ref Range   Sodium 140 135 - 145 mmol/L  Potassium 3.8 3.5 - 5.1 mmol/L   Chloride 105 98 - 111 mmol/L   CO2 22 22 - 32 mmol/L   Glucose, Bld 109 (H) 70 - 99 mg/dL   BUN 14 6 - 20 mg/dL   Creatinine, Ser 1.17 0.61 - 1.24 mg/dL   Calcium 9.3 8.9 - 10.3 mg/dL   Total Protein 7.7 6.5 - 8.1 g/dL   Albumin 4.7 3.5 - 5.0 g/dL   AST 26 15 - 41 U/L   ALT 40 0 - 44 U/L   Alkaline Phosphatase 22 (L) 38 - 126 U/L   Total Bilirubin 1.2 0.3 - 1.2 mg/dL   GFR calc non Af Amer >60 >60 mL/min   GFR calc Af Amer >60 >60 mL/min   Anion gap 13 5 - 15    Comment: Performed at [facility], [address] [place], [place] [address]  APTT     Status: None   Collection Time: 08/16/19  7:41 PM  Result Value Ref Range   aPTT 25 24 - 36 seconds    Comment: Performed at [facility], [address]  [place], [place] [address]  Protime-INR     Status: None   Collection Time: 08/16/19  7:41 PM  Result Value Ref Range   Prothrombin Time 13.1 11.4 - 15.2 seconds   INR 1.0 0.8 - 1.2    Comment: (NOTE) INR goal varies based on device and disease states. Performed at [facility], [address] [place], [place] [address]   SARS Coronavirus 2 [facility] order, Performed in [facility] hospital lab) Nasopharyngeal Nasopharyngeal Swab     Status: None   Collection Time: 08/16/19  8:50 PM   Specimen: Nasopharyngeal Swab  Result Value Ref Range   SARS Coronavirus 2 NEGATIVE NEGATIVE    Comment: (NOTE) If result is NEGATIVE SARS-CoV-2 target nucleic acids are NOT DETECTED. The SARS-CoV-2 RNA is generally detectable in upper and lower  respiratory specimens during the acute phase of infection. The lowest  concentration of SARS-CoV-2 viral copies this assay can detect is 250  copies / mL. A negative result does not preclude SARS-CoV-2 infection  and should not be used as the sole basis for treatment or other  patient management decisions.  A negative result may occur with  improper specimen collection / handling, submission of specimen other  than nasopharyngeal swab, presence of viral mutation(s) within the  areas targeted by this assay, and inadequate number of viral copies  (<250 copies / mL). A negative result must be combined with clinical  observations, patient history, and epidemiological information. If result is POSITIVE SARS-CoV-2 target nucleic acids are DETECTED. The SARS-CoV-2 RNA is generally detectable in upper and lower  respiratory specimens dur ing the acute phase of infection.  Positive  results are indicative of active infection with SARS-CoV-2.  Clinical  correlation with patient history and other diagnostic information is  necessary to determine patient infection status.  Positive results do  not rule out bacterial infection or co-infection with other  viruses. If result is PRESUMPTIVE POSTIVE SARS-CoV-2 nucleic acids MAY BE PRESENT.   A presumptive positive result was obtained on the submitted specimen  and confirmed on repeat testing.  While 2019 novel coronavirus  (SARS-CoV-2) nucleic acids may be present in the submitted sample  additional confirmatory testing may be necessary for epidemiological  and / or clinical management purposes  to differentiate between  SARS-CoV-2 and other Sarbecovirus currently known to infect humans.  If clinically  indicated additional testing with an alternate test  methodology [phone]) is advised. The SARS-CoV-2 RNA is generally  detectable in upper and lower respiratory sp ecimens during the acute  phase of infection. The expected result is Negative. Fact Sheet for Patients:  StrictlyIdeas.no Fact Sheet for Healthcare Providers: BankingDealers.co.za This test is not yet approved or cleared by the [place] FDA and has been authorized for detection and/or diagnosis of SARS-CoV-2 by FDA under an Emergency Use Authorization (EUA).  This EUA will remain in effect (meaning this test can be used) for the duration of the COVID-19 declaration under Section 564(b)(1) of the Act, 21 U.S.C. section 360bbb-3(b)(1), unless the authorization is terminated or revoked sooner. Performed at [facility], [address] [place], [place] [address]    Dg Ankle Complete Right  Result Date: 08/16/2019 CLINICAL DATA:  Fall, injury, deformity EXAM: RIGHT ANKLE - COMPLETE 3+ VIEW COMPARISON:  Knee series performed today FINDINGS: There is a oblique fracture through the distal right tibial shaft. Moderate displacement. The proximal fibular fracture seen on knee series not visualized on this ankle series. IMPRESSION: Displaced oblique distal right tibial shaft fracture. Electronically Signed   By: Rolm Baptise M.D.   On: 08/16/2019 20:26   Ct Ankle Right Wo  Contrast  Result Date: 08/16/2019 CLINICAL DATA:  33 year old male with trauma to the right ankle. EXAM: CT OF THE RIGHT ANKLE WITHOUT CONTRAST TECHNIQUE: Multidetector CT imaging of the right ankle was performed according to the standard protocol. Multiplanar CT image reconstructions were also generated. COMPARISON:  Right ankle radiograph dated 08/16/2019 FINDINGS: Bones/Joint/Cartilage There is a displaced oblique fracture of the distal tibial diaphysis with approximately 5 mm lateral displacement the distal fracture fragment. There is a nondisplaced intra-articular fracture of the posterior malleolus. A tiny bone fragment adjacent to the anterolateral corner of the tibial plafond most consistent with a displaced cortical avulsion fracture. There is no dislocation. The ankle mortise is intact. Ligaments Suboptimally assessed by CT. Muscles and Tendons No intramuscular hematoma. Soft tissues Diffuse subcutaneous soft tissue swelling. No fluid collection. IMPRESSION: 1. Displaced oblique fracture of the distal tibial diaphysis. 2. Nondisplaced intra-articular fracture of the posterior malleolus. 3. Tiny displaced cortical avulsion fracture of the anterolateral corner of the tibial plafond. Electronically Signed   By: Anner Crete M.D.   On: 08/16/2019 22:05   Dg Chest Port 1 View  Result Date: 08/16/2019 CLINICAL DATA:  Preoperative EXAM: PORTABLE CHEST 1 VIEW COMPARISON:  None. FINDINGS: The heart size and mediastinal contours are within normal limits. Both lungs are clear. The visualized skeletal structures are unremarkable. IMPRESSION: No acute abnormality of the lungs in AP portable projection. Electronically Signed   By: Eddie Candle M.D.   On: 08/16/2019 21:19   Dg Knee Complete 4 Views Right  Result Date: 08/16/2019 CLINICAL DATA:  Fall, pain, deformity EXAM: RIGHT KNEE - COMPLETE 4+ VIEW COMPARISON:  None. FINDINGS: There is an oblique displaced fibular neck fracture. The distal fragment is  displaced posteriorly and laterally. No additional acute bony abnormality. No joint effusion. IMPRESSION: Displaced right fibular neck fracture. Electronically Signed   By: Rolm Baptise M.D.   On: 08/16/2019 20:26    Pending Labs [employer] (From admission, onward)    Start     Ordered   Signed and Held  HIV antibody (Routine Testing)  Once,   R     Signed and Held          Vitals/Pain Today's Vitals   08/16/19  2000 08/16/19 2030 08/16/19 2100 08/16/19 2311  BP: (!) 140/103 (!) 155/111 (!) 153/114 (!) 147/88  Pulse: 69 66 71   Resp:    20  Temp:      TempSrc:      SpO2: 95% 97% 100% 100%  Weight:      Height:      PainSc:        Isolation Precautions No active isolations  Medications Medications  HYDROmorphone (DILAUDID) injection 1 mg (1 mg Intravenous Given 08/16/19 1945)    Mobility walks High fall risk   Focused Assessments Orthopedic   R Recommendations: See Admitting Provider Note  Report given to:   Additional Notes:

## 2019-08-16 NOTE — ED Notes (Signed)
Tech Lydia and Sorin Frimpong assisted Dr. Corky Downs with application of splint long leg posterior and stir up.AS

## 2019-08-17 ENCOUNTER — Inpatient Hospital Stay: Payer: Self-pay

## 2019-08-17 ENCOUNTER — Encounter
Admission: EM | Disposition: A | Discharge status: Home Health | Payer: Self-pay | Source: Home / Self Care | Attending: Orthopedic Surgery

## 2019-08-17 ENCOUNTER — Encounter: Payer: Self-pay | Admitting: Anesthesiology

## 2019-08-17 ENCOUNTER — Other Ambulatory Visit: Payer: Self-pay

## 2019-08-17 ENCOUNTER — Inpatient Hospital Stay: Payer: Self-pay | Admitting: Anesthesiology

## 2019-08-17 HISTORY — PX: TIBIA IM NAIL INSERTION: SHX2516

## 2019-08-17 LAB — URINE DRUG SCREEN, QUALITATIVE (ARMC ONLY)
Amphetamines, Ur Screen: NOT DETECTED
Barbiturates, Ur Screen: NOT DETECTED
Benzodiazepine, Ur Scrn: NOT DETECTED
Cannabinoid 50 Ng, Ur ~~LOC~~: POSITIVE — AB
Cocaine Metabolite,Ur ~~LOC~~: NOT DETECTED
MDMA (Ecstasy)Ur Screen: NOT DETECTED
Methadone Scn, Ur: NOT DETECTED
Opiate, Ur Screen: POSITIVE — AB
Phencyclidine (PCP) Ur S: NOT DETECTED
Tricyclic, Ur Screen: NOT DETECTED

## 2019-08-17 LAB — SURGICAL PCR SCREEN
MRSA, PCR: NEGATIVE
Staphylococcus aureus: POSITIVE — AB

## 2019-08-17 SURGERY — INSERTION, INTRAMEDULLARY ROD, TIBIA
Anesthesia: General | Site: Leg Lower | Laterality: Right

## 2019-08-17 MED ORDER — CEFAZOLIN SODIUM-DEXTROSE 2-4 GM/100ML-% IV SOLN
2.0000 g | Freq: Four times a day (QID) | INTRAVENOUS | Status: AC
  Administered 2019-08-18 (×2): 2 g via INTRAVENOUS
  Filled 2019-08-17 (×3): qty 100

## 2019-08-17 MED ORDER — SODIUM CHLORIDE 0.9 % IR SOLN
Status: DC | PRN
  Administered 2019-08-17: 14:00:00 1000 mL

## 2019-08-17 MED ORDER — DIPHENHYDRAMINE HCL 12.5 MG/5ML PO ELIX: 12.5000 mg | ORAL_SOLUTION | ORAL | Status: DC | PRN

## 2019-08-17 MED ORDER — NEOMYCIN-POLYMYXIN B GU 40-200000 IR SOLN
Status: AC
  Filled 2019-08-17: qty 4

## 2019-08-17 MED ORDER — HYDROMORPHONE HCL 1 MG/ML IJ SOLN
INTRAMUSCULAR | Status: AC
  Filled 2019-08-17: qty 1

## 2019-08-17 MED ORDER — METHOCARBAMOL 1000 MG/10ML IJ SOLN
500.0000 mg | Freq: Four times a day (QID) | INTRAVENOUS | Status: DC | PRN
  Filled 2019-08-17: qty 5

## 2019-08-17 MED ORDER — TRAMADOL HCL 50 MG PO TABS
50.0000 mg | ORAL_TABLET | Freq: Four times a day (QID) | ORAL | Status: DC
  Administered 2019-08-17 (×2): 50 mg via ORAL
  Filled 2019-08-17 (×2): qty 1

## 2019-08-17 MED ORDER — ONDANSETRON HCL 4 MG/2ML IJ SOLN
INTRAMUSCULAR | Status: AC
  Filled 2019-08-17: qty 2

## 2019-08-17 MED ORDER — ACETAMINOPHEN 10 MG/ML IV SOLN
INTRAVENOUS | Status: DC | PRN
  Administered 2019-08-17: 1000 mg via INTRAVENOUS

## 2019-08-17 MED ORDER — CEFAZOLIN SODIUM-DEXTROSE 2-4 GM/100ML-% IV SOLN
2.0000 g | Freq: Three times a day (TID) | INTRAVENOUS | Status: DC
  Administered 2019-08-17 (×2): 2 g via INTRAVENOUS
  Filled 2019-08-17 (×4): qty 100

## 2019-08-17 MED ORDER — DEXMEDETOMIDINE HCL IN NACL 80 MCG/20ML IV SOLN
INTRAVENOUS | Status: AC
  Filled 2019-08-17: qty 20

## 2019-08-17 MED ORDER — HYDROMORPHONE HCL 1 MG/ML IJ SOLN: 0.2500 mg | INTRAMUSCULAR | Status: DC | PRN

## 2019-08-17 MED ORDER — FENTANYL CITRATE (PF) 100 MCG/2ML IJ SOLN
25.0000 ug | INTRAMUSCULAR | Status: AC | PRN
  Administered 2019-08-17 (×6): 25 ug via INTRAVENOUS

## 2019-08-17 MED ORDER — HYDROMORPHONE HCL 1 MG/ML IJ SOLN
0.5000 mg | INTRAMUSCULAR | Status: DC | PRN
  Administered 2019-08-17 (×2): 0.5 mg via INTRAVENOUS

## 2019-08-17 MED ORDER — OXYCODONE HCL 5 MG PO TABS
10.0000 mg | ORAL_TABLET | ORAL | Status: DC | PRN
  Administered 2019-08-17: 10 mg via ORAL
  Filled 2019-08-17: qty 2

## 2019-08-17 MED ORDER — FENTANYL CITRATE (PF) 100 MCG/2ML IJ SOLN
INTRAMUSCULAR | Status: DC | PRN
  Administered 2019-08-17 (×2): 100 ug via INTRAVENOUS

## 2019-08-17 MED ORDER — PROPOFOL 10 MG/ML IV BOLUS
INTRAVENOUS | Status: AC
  Filled 2019-08-17: qty 20

## 2019-08-17 MED ORDER — MIDAZOLAM HCL 2 MG/2ML IJ SOLN
INTRAMUSCULAR | Status: DC | PRN
  Administered 2019-08-17: 2 mg via INTRAVENOUS

## 2019-08-17 MED ORDER — ACETAMINOPHEN 500 MG PO TABS
1000.0000 mg | ORAL_TABLET | Freq: Four times a day (QID) | ORAL | Status: DC
  Administered 2019-08-17: 01:00:00 1000 mg via ORAL
  Filled 2019-08-17: qty 2

## 2019-08-17 MED ORDER — LIDOCAINE HCL (PF) 2 % IJ SOLN
INTRAMUSCULAR | Status: AC
  Filled 2019-08-17: qty 10

## 2019-08-17 MED ORDER — ROCURONIUM BROMIDE 50 MG/5ML IV SOLN
INTRAVENOUS | Status: AC
  Filled 2019-08-17: qty 3

## 2019-08-17 MED ORDER — OXYCODONE HCL 5 MG PO TABS
5.0000 mg | ORAL_TABLET | ORAL | Status: DC | PRN
  Administered 2019-08-17: 10 mg via ORAL
  Filled 2019-08-17: qty 2

## 2019-08-17 MED ORDER — TRAMADOL HCL 50 MG PO TABS
50.0000 mg | ORAL_TABLET | Freq: Four times a day (QID) | ORAL | Status: DC | PRN
  Administered 2019-08-18: 50 mg via ORAL
  Filled 2019-08-17: qty 1

## 2019-08-17 MED ORDER — ONDANSETRON HCL 4 MG/2ML IJ SOLN
INTRAMUSCULAR | Status: DC | PRN
  Administered 2019-08-17: 4 mg via INTRAVENOUS

## 2019-08-17 MED ORDER — DOCUSATE SODIUM 100 MG PO CAPS
100.0000 mg | ORAL_CAPSULE | Freq: Two times a day (BID) | ORAL | Status: DC
  Administered 2019-08-17: 100 mg via ORAL
  Filled 2019-08-17: qty 1

## 2019-08-17 MED ORDER — LIDOCAINE HCL (CARDIAC) PF 100 MG/5ML IV SOSY
PREFILLED_SYRINGE | INTRAVENOUS | Status: DC | PRN
  Administered 2019-08-17: 80 mg via INTRAVENOUS

## 2019-08-17 MED ORDER — ACETAMINOPHEN 500 MG PO TABS
1000.0000 mg | ORAL_TABLET | Freq: Three times a day (TID) | ORAL | Status: DC
  Administered 2019-08-17 – 2019-08-18 (×2): 1000 mg via ORAL
  Filled 2019-08-17 (×3): qty 2

## 2019-08-17 MED ORDER — HYDROMORPHONE HCL 1 MG/ML IJ SOLN
INTRAMUSCULAR | Status: AC
  Filled 2019-08-17: qty 2

## 2019-08-17 MED ORDER — METHOCARBAMOL 500 MG PO TABS
500.0000 mg | ORAL_TABLET | Freq: Four times a day (QID) | ORAL | Status: DC | PRN
  Administered 2019-08-18: 500 mg via ORAL
  Filled 2019-08-17: qty 1

## 2019-08-17 MED ORDER — METOCLOPRAMIDE HCL 10 MG PO TABS: 5.0000 mg | ORAL_TABLET | Freq: Three times a day (TID) | ORAL | Status: DC | PRN

## 2019-08-17 MED ORDER — BISACODYL 10 MG RE SUPP: 10.0000 mg | Freq: Every day | RECTAL | Status: DC | PRN

## 2019-08-17 MED ORDER — FENTANYL CITRATE (PF) 100 MCG/2ML IJ SOLN
INTRAMUSCULAR | Status: AC
  Administered 2019-08-17: 25 ug via INTRAVENOUS
  Filled 2019-08-17: qty 2

## 2019-08-17 MED ORDER — ASPIRIN EC 325 MG PO TBEC
325.0000 mg | DELAYED_RELEASE_TABLET | Freq: Every day | ORAL | Status: DC
  Administered 2019-08-18: 10:00:00 325 mg via ORAL
  Filled 2019-08-17: qty 1

## 2019-08-17 MED ORDER — MIDAZOLAM HCL 2 MG/2ML IJ SOLN
INTRAMUSCULAR | Status: AC
  Filled 2019-08-17: qty 2

## 2019-08-17 MED ORDER — ONDANSETRON HCL 4 MG PO TABS: 4.0000 mg | ORAL_TABLET | Freq: Four times a day (QID) | ORAL | Status: DC | PRN

## 2019-08-17 MED ORDER — FLEET ENEMA 7-19 GM/118ML RE ENEM: 1.0000 | ENEMA | Freq: Once | RECTAL | Status: DC | PRN

## 2019-08-17 MED ORDER — DOCUSATE SODIUM 100 MG PO CAPS
100.0000 mg | ORAL_CAPSULE | Freq: Two times a day (BID) | ORAL | Status: DC
  Administered 2019-08-17 – 2019-08-18 (×2): 100 mg via ORAL
  Filled 2019-08-17 (×2): qty 1

## 2019-08-17 MED ORDER — CHLORHEXIDINE GLUCONATE CLOTH 2 % EX PADS: 6.0000 | MEDICATED_PAD | Freq: Every day | CUTANEOUS | Status: DC

## 2019-08-17 MED ORDER — SODIUM CHLORIDE 0.9 % IV SOLN
INTRAVENOUS | Status: DC
  Administered 2019-08-17: 18:00:00 via INTRAVENOUS

## 2019-08-17 MED ORDER — FENTANYL CITRATE (PF) 100 MCG/2ML IJ SOLN
INTRAMUSCULAR | Status: AC
  Filled 2019-08-17: qty 2

## 2019-08-17 MED ORDER — SENNOSIDES-DOCUSATE SODIUM 8.6-50 MG PO TABS: 1.0000 | ORAL_TABLET | Freq: Every evening | ORAL | Status: DC | PRN

## 2019-08-17 MED ORDER — ROCURONIUM BROMIDE 100 MG/10ML IV SOLN
INTRAVENOUS | Status: DC | PRN
  Administered 2019-08-17: 20 mg via INTRAVENOUS
  Administered 2019-08-17: 10 mg via INTRAVENOUS
  Administered 2019-08-17 (×2): 20 mg via INTRAVENOUS
  Administered 2019-08-17: 50 mg via INTRAVENOUS
  Administered 2019-08-17 (×3): 20 mg via INTRAVENOUS
  Administered 2019-08-17: 10 mg via INTRAVENOUS
  Administered 2019-08-17: 20 mg via INTRAVENOUS
  Administered 2019-08-17: 10 mg via INTRAVENOUS

## 2019-08-17 MED ORDER — PROPOFOL 10 MG/ML IV BOLUS
INTRAVENOUS | Status: DC | PRN
  Administered 2019-08-17: 180 mg via INTRAVENOUS

## 2019-08-17 MED ORDER — METOCLOPRAMIDE HCL 5 MG/ML IJ SOLN: 5.0000 mg | Freq: Three times a day (TID) | INTRAMUSCULAR | Status: DC | PRN

## 2019-08-17 MED ORDER — OXYCODONE HCL 5 MG PO TABS: 2.5000 mg | ORAL_TABLET | ORAL | Status: DC | PRN

## 2019-08-17 MED ORDER — ONDANSETRON HCL 4 MG/2ML IJ SOLN: 4.0000 mg | Freq: Four times a day (QID) | INTRAMUSCULAR | Status: DC | PRN

## 2019-08-17 MED ORDER — METOPROLOL TARTRATE 25 MG PO TABS
25.0000 mg | ORAL_TABLET | Freq: Two times a day (BID) | ORAL | Status: DC
  Administered 2019-08-17 – 2019-08-18 (×2): 25 mg via ORAL
  Filled 2019-08-17 (×2): qty 1

## 2019-08-17 MED ORDER — DEXMEDETOMIDINE HCL 200 MCG/2ML IV SOLN
INTRAVENOUS | Status: DC | PRN
  Administered 2019-08-17: 20 ug via INTRAVENOUS

## 2019-08-17 MED ORDER — DEXAMETHASONE SODIUM PHOSPHATE 10 MG/ML IJ SOLN
INTRAMUSCULAR | Status: AC
  Filled 2019-08-17: qty 1

## 2019-08-17 MED ORDER — PHENYLEPHRINE HCL (PRESSORS) 10 MG/ML IV SOLN
INTRAVENOUS | Status: DC | PRN
  Administered 2019-08-17: 50 ug via INTRAVENOUS
  Administered 2019-08-17: 100 ug via INTRAVENOUS
  Administered 2019-08-17: 50 ug via INTRAVENOUS
  Administered 2019-08-17: 100 ug via INTRAVENOUS

## 2019-08-17 MED ORDER — SEVOFLURANE IN SOLN
RESPIRATORY_TRACT | Status: AC
  Filled 2019-08-17: qty 250

## 2019-08-17 MED ORDER — ACETAMINOPHEN 10 MG/ML IV SOLN
INTRAVENOUS | Status: AC
  Filled 2019-08-17: qty 100

## 2019-08-17 MED ORDER — SUGAMMADEX SODIUM 200 MG/2ML IV SOLN
INTRAVENOUS | Status: DC | PRN
  Administered 2019-08-17: 200 mg via INTRAVENOUS

## 2019-08-17 MED ORDER — ONDANSETRON HCL 4 MG/2ML IJ SOLN: 4.0000 mg | Freq: Once | INTRAMUSCULAR | Status: DC | PRN

## 2019-08-17 MED ORDER — ACETAMINOPHEN 325 MG PO TABS
325.0000 mg | ORAL_TABLET | Freq: Four times a day (QID) | ORAL | Status: DC | PRN
  Administered 2019-08-18: 650 mg via ORAL

## 2019-08-17 MED ORDER — ACETAMINOPHEN 10 MG/ML IV SOLN
1000.0000 mg | Freq: Once | INTRAVENOUS | Status: AC
  Administered 2019-08-17: 1000 mg via INTRAVENOUS
  Filled 2019-08-17: qty 100

## 2019-08-17 MED ORDER — MUPIROCIN 2 % EX OINT
1.0000 "application " | TOPICAL_OINTMENT | Freq: Two times a day (BID) | CUTANEOUS | Status: DC
  Administered 2019-08-17: 1 via NASAL
  Filled 2019-08-17: qty 22

## 2019-08-17 MED ORDER — DEXAMETHASONE SODIUM PHOSPHATE 10 MG/ML IJ SOLN
INTRAMUSCULAR | Status: DC | PRN
  Administered 2019-08-17: 10 mg via INTRAVENOUS

## 2019-08-17 MED ORDER — OXYCODONE HCL 5 MG PO TABS
5.0000 mg | ORAL_TABLET | ORAL | Status: DC | PRN
  Administered 2019-08-17: 10 mg via ORAL
  Administered 2019-08-18: 5 mg via ORAL
  Filled 2019-08-17: qty 1
  Filled 2019-08-17: qty 2

## 2019-08-17 MED ORDER — HYDROMORPHONE HCL 1 MG/ML IJ SOLN
0.5000 mg | INTRAMUSCULAR | Status: DC | PRN
  Administered 2019-08-17: .4 mg via INTRAVENOUS
  Administered 2019-08-17: .2 mg via INTRAVENOUS
  Administered 2019-08-17: .4 mg via INTRAVENOUS
  Administered 2019-08-17: 1 mg via INTRAVENOUS
  Administered 2019-08-17: .6 mg via INTRAVENOUS
  Administered 2019-08-17: .4 mg via INTRAVENOUS
  Filled 2019-08-17: qty 1

## 2019-08-17 MED ORDER — ROCURONIUM BROMIDE 50 MG/5ML IV SOLN
INTRAVENOUS | Status: AC
  Filled 2019-08-17: qty 1

## 2019-08-17 MED ORDER — LACTATED RINGERS IV SOLN
INTRAVENOUS | Status: DC | PRN
  Administered 2019-08-17 (×2): via INTRAVENOUS

## 2019-08-17 MED ORDER — METHOCARBAMOL 500 MG PO TABS
500.0000 mg | ORAL_TABLET | Freq: Four times a day (QID) | ORAL | Status: DC | PRN
  Administered 2019-08-17: 500 mg via ORAL
  Filled 2019-08-17: qty 1

## 2019-08-17 MED ORDER — SUGAMMADEX SODIUM 200 MG/2ML IV SOLN
INTRAVENOUS | Status: AC
  Filled 2019-08-17: qty 2

## 2019-08-17 SURGICAL SUPPLY — 64 items
BIT DRILL AO GAMMA 4.2X130 (BIT) ×4 IMPLANT
BIT DRILL AO GAMMA 4.2X340 (BIT) ×2 IMPLANT
BIT DRILL CANN 2.7 (BIT) ×1
BIT DRILL CANN 2.7MM (BIT) ×1
BIT DRILL CANN QC 4.0X145 (BIT) IMPLANT
BIT DRILL SRG 2.7XCANN AO CPLG (BIT) IMPLANT
BIT DRL SRG 2.7XCANN AO CPLNG (BIT) ×1
BLADE CLIPPER SURG (BLADE) ×3 IMPLANT
CANISTER SUCT 1200ML W/VALVE (MISCELLANEOUS) ×3 IMPLANT
CHLORAPREP W/TINT 26 (MISCELLANEOUS) ×3 IMPLANT
COVER WAND RF STERILE (DRAPES) ×3 IMPLANT
CUFF TOURN SGL QUICK 24 (TOURNIQUET CUFF)
CUFF TOURN SGL QUICK 30 (TOURNIQUET CUFF) ×2
CUFF TRNQT CYL 24X4X16.5-23 (TOURNIQUET CUFF) IMPLANT
CUFF TRNQT CYL 30X4X21-28X (TOURNIQUET CUFF) IMPLANT
DRAPE C-ARM XRAY 36X54 (DRAPES) ×3 IMPLANT
DRAPE C-ARMOR (DRAPES) ×3 IMPLANT
DRILL BIT CANN 4.0MM (BIT) ×3
ELECT CAUTERY BLADE 6.4 (BLADE) ×3 IMPLANT
ELECT REM PT RETURN 9FT ADLT (ELECTROSURGICAL) ×3
ELECTRODE REM PT RTRN 9FT ADLT (ELECTROSURGICAL) ×1 IMPLANT
GAUZE SPONGE 4X4 12PLY STRL (GAUZE/BANDAGES/DRESSINGS) ×3 IMPLANT
GAUZE XEROFORM 1X8 LF (GAUZE/BANDAGES/DRESSINGS) ×3 IMPLANT
GLOVE BIO SURGEON STRL SZ8 (GLOVE) ×2 IMPLANT
GLOVE BIOGEL PI IND STRL 8 (GLOVE) ×1 IMPLANT
GLOVE BIOGEL PI INDICATOR 8 (GLOVE) ×2
GLOVE SURG SYN 7.5  E (GLOVE) ×4
GLOVE SURG SYN 7.5 E (GLOVE) ×2 IMPLANT
GLOVE SURG SYN 7.5 PF PI (GLOVE) ×2 IMPLANT
GOWN STRL REUS W/ TWL LRG LVL3 (GOWN DISPOSABLE) ×1 IMPLANT
GOWN STRL REUS W/ TWL XL LVL3 (GOWN DISPOSABLE) ×1 IMPLANT
GOWN STRL REUS W/TWL LRG LVL3 (GOWN DISPOSABLE) ×2
GOWN STRL REUS W/TWL XL LVL3 (GOWN DISPOSABLE) ×2
GUIDEROD T2 3X1000 (ROD) ×2 IMPLANT
GUIDEWIRE GAMMA (WIRE) ×2 IMPLANT
K-WIRE FIXATION 3X285 COATED (WIRE) ×6
K-WIRE ORTHOPEDIC 1.4X150L (WIRE) ×3
KIT TURNOVER KIT A (KITS) ×3 IMPLANT
KWIRE FIXATION 3X285 COATED (WIRE) IMPLANT
KWIRE ORTHOPEDIC 1.4X150L (WIRE) IMPLANT
NAIL ELAS INSERT SLV SPI 8-11 (MISCELLANEOUS) ×2 IMPLANT
NAIL TIBIAL STANDARD 9X360MM (Nail) ×3 IMPLANT
NAIL TIBIAL STD 9X360MM (Nail) IMPLANT
NDL FILTER BLUNT 18X1 1/2 (NEEDLE) ×1 IMPLANT
NEEDLE FILTER BLUNT 18X 1/2SAF (NEEDLE) ×2
NEEDLE FILTER BLUNT 18X1 1/2 (NEEDLE) ×1 IMPLANT
NS IRRIG 1000ML POUR BTL (IV SOLUTION) ×3 IMPLANT
PACK TOTAL KNEE (MISCELLANEOUS) ×3 IMPLANT
REAMER INTRAMEDULLARY 8MM 510 (MISCELLANEOUS) ×2 IMPLANT
SCREW BONE CANN 4.0X42MM (Screw) ×2 IMPLANT
SCREW LOCKING T2 F/T  5MMX35MM (Screw) ×2 IMPLANT
SCREW LOCKING T2 F/T  5MMX40MM (Screw) ×2 IMPLANT
SCREW LOCKING T2 F/T  5MMX70MM (Screw) ×4 IMPLANT
SCREW LOCKING T2 F/T  5X37.5MM (Screw) ×2 IMPLANT
SCREW LOCKING T2 F/T 5MMX35MM (Screw) IMPLANT
SCREW LOCKING T2 F/T 5MMX40MM (Screw) IMPLANT
SCREW LOCKING T2 F/T 5MMX70MM (Screw) IMPLANT
SCREW LOCKING T2 F/T 5X37.5MM (Screw) IMPLANT
STAPLER SKIN PROX 35W (STAPLE) ×3 IMPLANT
SUT VIC AB 0 CT1 36 (SUTURE) ×3 IMPLANT
SUT VIC AB 2-0 CT1 (SUTURE) ×3 IMPLANT
SYR 5ML LL (SYRINGE) ×3 IMPLANT
TOWEL OR 17X26 4PK STRL BLUE (TOWEL DISPOSABLE) ×4 IMPLANT
WASHER ASNIS III 4.0 SCR (Washer) ×2 IMPLANT

## 2019-08-17 NOTE — Anesthesia Preprocedure Evaluation (Signed)
Anesthesia Evaluation  Patient identified by MRN, date of birth, ID band Patient awake    Reviewed: Allergy & Precautions, H&P , NPO status , Patient's Chart, lab work & pertinent test results, reviewed documented beta blocker date and time   Airway Mallampati: II  TM Distance: >3 FB Neck ROM: full    Dental  (+) Teeth Intact   Pulmonary pneumonia, resolved, Current Smoker and Patient abstained from smoking.,    Pulmonary exam normal        Cardiovascular Exercise Tolerance: Good negative cardio ROS Normal cardiovascular exam Rhythm:regular Rate:Normal     Neuro/Psych  Headaches, negative psych ROS   GI/Hepatic negative GI ROS, Neg liver ROS,   Endo/Other  negative endocrine ROS  Renal/GU negative Renal ROS  negative genitourinary   Musculoskeletal   Abdominal   Peds  Hematology negative hematology ROS (+)   Anesthesia Other Findings Past Medical History: No date: Elevated blood pressure reading No date: Fluttering heart     Comment:  SAW DR Rockey Situ ON 07-04-17-EKG DONE AND WAS WNL-PT TO F/U               IF NEEDED No date: Hemorrhoids 2014: Pneumonia Past Surgical History: 09/23/2017: HEMORRHOID SURGERY; N/A     Comment:  Procedure: HEMORRHOIDECTOMY;  Surgeon: Christene Lye, MD;  Location: [facility] ORS;  Service:               General;  Laterality: N/A; 09/23/2017: RECTAL EXAM UNDER ANESTHESIA     Comment:  Procedure: RECTAL EXAM UNDER ANESTHESIA;  Surgeon:               Christene Lye, MD;  Location: [facility] ORS;                Service: General;; No date: WISDOM TOOTH EXTRACTION BMI    Body Mass Index: 25.48 kg/m     Reproductive/Obstetrics negative OB ROS                             Anesthesia Physical Anesthesia Plan  ASA: II and emergent  Anesthesia Plan: General ETT   Post-op Pain Management:    Induction:   PONV Risk Score and Plan:    Airway Management Planned:   Additional Equipment:   Intra-op Plan:   Post-operative Plan:   Informed Consent: I have reviewed the patients History and Physical, chart, labs and discussed the procedure including the risks, benefits and alternatives for the proposed anesthesia with the patient or authorized representative who has indicated his/her understanding and acceptance.     Dental Advisory Given  Plan Discussed with: CRNA  Anesthesia Plan Comments:         Anesthesia Quick Evaluation

## 2019-08-17 NOTE — Op Note (Addendum)
DATE OF SURGERY: 08/17/2019  PREOPERATIVE DIAGNOSIS:  1.  Right tibia fracture 2.  Right ankle (posterior malleolus) fracture  3.  Right proximal fibula fracture  POSTOPERATIVE DIAGNOSIS:  1.  Right tibia fracture 2.  Right ankle (posterior malleolus) fracture  3.  Right proximal fibula fracture  PROCEDURE: 1. Intramedullary nailing of right tibia  2. ORIF right ankle (posterior malleolus)   SURGEON: Cato Mulligan, MD  ASSISTANTS: none  EBL: 25cc  COMPONENTS:  [employer] T2 Nail: 9 x 353mm; 2 proximal interlocking screws; 3 distal interolocking screws.  4.0 mm fully threaded cancellous screw   INDICATIONS: Cory Ward is a 33 y.o. male who sustained a tibial shaft fracture, posterior malleolus fracture, and proximal fibula fracture after he had a twisting injury to his right knee while rollerblading.  Patient was placed in a splint to the emergency department after radiographic findings.. Risks and benefits of intramedullary nailing of tibia were explained to the patient. Risks include but are not limited to bleeding, infection, injury to tissues, nerves, vessels, periprosthetic infection, dislocation, bony malalignment, limb length discrepancy and risks of anesthesia. The patient understands these risks, has completed an informed consent, and wishes to proceed.   PROCEDURE:  The patient was brought into the operating room. After administering anesthesia, the patient was placed in the supine position on a radiolucent table. Anesthesia was administered. Preoperative IV antibiotics were administered. The leg was prepped with ChloraPrep solution before being draped sterilely in the standard fashion. A timeout was performed to verify the appropriate surgical site, patient, and procedure.   I first turned my attention towards fixation of the posterior malleolus fragment.  Under fluoroscopic visualization, an anterolateral stab incision was made just lateral to EDL.  Soft tissue was  dissected until anterolateral tibia was reached.  Next, a small stab incision was made along the posterior lateral aspect of the ankle.  Upon doing this, there was significant venous bleeding and so this incision was extended and hemostasis was achieved by coagulating a superficial vein with a Bovie electrocautery.  A pointed reduction clamp was placed around the anterior and posterior borders of the tibia to hold the posterior malleolus fracture in a reduced position.  A 4.0 mm fully threaded cancellous screw was placed in an anterior to posterior direction after over drilling the near cortex to allow for appropriate compression.  The pointed reduction clamp was removed and the posterior malleolus was noted to be stable with this fixation.  Next, attention was turned towards the intramedullary nailing of the tibia.  An approximately 5 cm incision was made proximal to the superior pole of the patella.  The quadriceps tendon was identified and a longitudinal incision was made in the midportion of the quadriceps tendon.  This allowed excellent access to the patellofemoral joint.  A protective sleeve was inserted and our starting point was found just medial to the lateral tibial spine on the AP view and just off of the anterior articular cartilage on the lateral view.  The guidepin was advanced appropriately.  An opening reamer was used to access the medullary canal of the tibia.  Next a ball-tipped guidewire was placed into the medullary canal and left just proximal to the fracture site.  Next, the fracture site was identified.  A stab incision was made was made just lateral to the tibial crest and blunt dissection was performed along the lateral tibial border.  One tyne of the pointed reduction clamp was placed along the posterior lateral aspect of  the tibia, and the other time was placed on the anteromedial aspect of the tibia after a small stab incision was made.  This was repeated slightly more distally for a  second reduction clamp.  Utilizing this reduction clamps, the fracture was reduced in a satisfactory position such that there was no coronal or sagittal plane malalignment.  The ball-tipped guidewire was then passed across the fracture site, and down to the physeal scar at the ankle.  Next, we began sequential reaming starting with a 32mm reamer and advancing to a 10.5 mm reamer.  This gave appropriate cortical chatter.  Next we used the sizer to measure the length of the nail and selected the nail size indicated above.  The nail was advanced to an appropriate position and reduction was maintained. Two 5.59mm proximal interlocking screws were placed in the static oblique medial and lateral positions in a standard fashion.  Next, distal interlocking screws were placed.  The distalmost lateral/medial screw was placed by slightly extending the previously made anterolateral incision for the posterior malleolus fixation.  The distal medial/ lateral distal interlocking 4.0 millimeter screw was placed using the perfect circle technique.  This was then repeated for the proximal medial/lateral screw and the anterior posterior interlocking screw.  We then confirmed appropriate fracture reduction fluoroscopically in both the AP and lateral planes.    The wounds were irrigated thoroughly with sterile saline solution.  The quadriceps tendon was closed with 0 Vicryl.  The quadriceps incision as well as the remainder of the wounds were closed with 2-0 Vicryl interrupted sutures in the dermis. The skin was closed using staples. Sterile occlusive dressings were applied to all wounds.  A short leg plaster splint was applied.  The patient was then transferred to the recovery room in satisfactory condition after tolerating the procedure well.  POSTOPERATIVE PLAN: The patient will be NWB on the operative extremity.  Can advance to partial weightbearing in 2 weeks at time of outpatient follow-up.  Aspirin 325 mg twice daily x4 weeks  for DVT prophylaxis.  Ancef x 24 hours. PT/OT on POD#1.

## 2019-08-17 NOTE — Progress Notes (Signed)
Pt to or.

## 2019-08-17 NOTE — H&P (Signed)
ORTHOPAEDIC HISTORY AND PHYSICAL  Chief Complaint:   R leg pain  History of Present Illness: [facility] Cory Ward is a 33 y.o. male who sustained a twisting injury to his right leg while he was rollerblading.  He noted immediate pain and deformity of his right leg.  He was unable to ambulate afterwards. Pain is described as sharp at its worst and a dull ache at its best.  Pain is rated a 10 out of 10 in severity at its worst, but it is currently manageable with splint and pain medications..  Pain is improved with  immobilization.  Pain is worse with any sort of movement.  X-rays in the emergency department show a right spiral distal tibial shaft fracture with CT scan showing a minimally displaced posterior malleolus fracture.  The patient states he smokes only while at work and a pack lasts him approximately 1 week.  He states he smokes marijuana daily, but no other drug use.  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  Past Medical History:  Diagnosis Date  . Elevated blood pressure reading   . Fluttering heart    SAW DR Rockey Situ ON 07-04-17-EKG DONE AND WAS WNL-PT TO F/U IF NEEDED  . Hemorrhoids   . Pneumonia 2014   Past Surgical History:  Procedure Laterality Date  . HEMORRHOID SURGERY N/A 09/23/2017   Procedure: HEMORRHOIDECTOMY;  Surgeon: Christene Lye, MD;  Location: [facility] ORS;  Service: General;  Laterality: N/A;  . RECTAL EXAM UNDER ANESTHESIA  09/23/2017   Procedure: RECTAL EXAM UNDER ANESTHESIA;  Surgeon: Christene Lye, MD;  Location: [facility] ORS;  Service: General;;  . WISDOM TOOTH EXTRACTION     Social History   Socioeconomic History  . Marital status: Single    Spouse name: Not on file  . Number of children: Not on file  . Years of education: Not on file  . Highest education level: Not on file  Occupational History  . Not on file  Social Needs  . Financial resource strain: Not on file  . Food insecurity    Worry: Not on file     Inability: Not on file  . Transportation needs    Medical: Not on file    Non-medical: Not on file  Tobacco Use  . Smoking status: Current Every Day Smoker    Packs/day: 0.25    Types: Cigarettes  . Smokeless tobacco: Never Used  . Tobacco comment: A pack will last him a week.   Substance and Sexual Activity  . Alcohol use: Yes    Comment: 2-3 BEERS DAILY  . Drug use: Yes    Types: Marijuana    Comment: DAILY  . Sexual activity: Not on file  Lifestyle  . Physical activity    Days per week: Not on file    Minutes per session: Not on file  . Stress: Not on file  Relationships  . Social Herbalist on phone: Not on file    Gets together: Not on file    Attends religious service: Not on file    Active member of club or organization: Not on file    Attends meetings of clubs or organizations: Not on file    Relationship status: Not on file  Other Topics Concern  . Not on file  Social History Narrative  . Not on file   Family History  Problem Relation Age of Onset  . Diabetes Maternal Grandmother   .  Heart disease Maternal Grandfather    No Known Allergies Prior to Admission medications   Medication Sig Start Date End Date Taking? Authorizing Provider  aspirin EC 81 MG tablet Take 81 mg by mouth as needed.    [provider]  docusate sodium (STOOL SOFTENER) 100 MG capsule Take 100 mg by mouth 2 (two) times daily.    [provider]  ibuprofen (ADVIL,MOTRIN) 200 MG tablet Take 200-400 mg by mouth every 8 (eight) hours as needed for mild pain (depends on pain if takes 1-2 tablets).    [provider]  phenylephrine-shark liver oil-mineral oil-petrolatum (PREPARATION H) 0.25-3-14-71.9 % rectal ointment Place 1 application rectally as needed for hemorrhoids.    [provider]  traMADol (ULTRAM) 50 MG tablet Take 1 tablet (50 mg total) by mouth every 6 (six) hours as needed. Patient not taking: Reported on 08/16/2019 09/23/17   Christene Lye, MD   Recent Labs    08/16/19 1941  WBC 7.7  HGB 14.9  HCT 42.8  PLT 292  K 3.8  CL 105  CO2 22  BUN 14  CREATININE 1.17  GLUCOSE 109*  CALCIUM 9.3  INR 1.0   Dg Ankle Complete Right  Result Date: 08/16/2019 CLINICAL DATA:  Fall, injury, deformity EXAM: RIGHT ANKLE - COMPLETE 3+ VIEW COMPARISON:  Knee series performed today FINDINGS: There is a oblique fracture through the distal right tibial shaft. Moderate displacement. The proximal fibular fracture seen on knee series not visualized on this ankle series. IMPRESSION: Displaced oblique distal right tibial shaft fracture. Electronically Signed   By: Rolm Baptise M.D.   On: 08/16/2019 20:26   Ct Ankle Right Wo Contrast  Result Date: 08/16/2019 CLINICAL DATA:  33 year old male with trauma to the right ankle. EXAM: CT OF THE RIGHT ANKLE WITHOUT CONTRAST TECHNIQUE: Multidetector CT imaging of the right ankle was performed according to the standard protocol. Multiplanar CT image reconstructions were also generated. COMPARISON:  Right ankle radiograph dated 08/16/2019 FINDINGS: Bones/Joint/Cartilage There is a displaced oblique fracture of the distal tibial diaphysis with approximately 5 mm lateral displacement the distal fracture fragment. There is a nondisplaced intra-articular fracture of the posterior malleolus. A tiny bone fragment adjacent to the anterolateral corner of the tibial plafond most consistent with a displaced cortical avulsion fracture. There is no dislocation. The ankle mortise is intact. Ligaments Suboptimally assessed by CT. Muscles and Tendons No intramuscular hematoma. Soft tissues Diffuse subcutaneous soft tissue swelling. No fluid collection. IMPRESSION: 1. Displaced oblique fracture of the distal tibial diaphysis. 2. Nondisplaced intra-articular fracture of the posterior malleolus. 3. Tiny displaced cortical avulsion fracture of the anterolateral corner of the tibial plafond. Electronically Signed   By:  Anner Crete M.D.   On: 08/16/2019 22:05   Dg Chest Port 1 View  Result Date: 08/16/2019 CLINICAL DATA:  Preoperative EXAM: PORTABLE CHEST 1 VIEW COMPARISON:  None. FINDINGS: The heart size and mediastinal contours are within normal limits. Both lungs are clear. The visualized skeletal structures are unremarkable. IMPRESSION: No acute abnormality of the lungs in AP portable projection. Electronically Signed   By: Eddie Candle M.D.   On: 08/16/2019 21:19   Dg Knee Complete 4 Views Right  Result Date: 08/16/2019 CLINICAL DATA:  Fall, pain, deformity EXAM: RIGHT KNEE - COMPLETE 4+ VIEW COMPARISON:  None. FINDINGS: There is an oblique displaced fibular neck fracture. The distal fragment is displaced posteriorly and laterally. No additional acute bony abnormality. No joint effusion. IMPRESSION: Displaced right fibular neck  fracture. Electronically Signed   By: Rolm Baptise M.D.   On: 08/16/2019 20:26     Positive ROS: All other systems have been reviewed and were otherwise negative with the exception of those mentioned in the HPI and as above.  Physical Exam: BP (!) 144/104 (BP Location: Right Arm)   Pulse (!) 55   Temp 97.8 F (36.6 C) (Oral)   Resp 18   Ht 6\' 1"  (1.854 m)   Wt 87.6 kg   SpO2 100%   BMI 25.48 kg/m  General:  Alert, no acute distress Psychiatric:  Patient is competent for consent with normal mood and affect   Cardiovascular:  No pedal edema, regular rate and rhythm Respiratory:  No wheezing, non-labored breathing GI:  Abdomen is soft and non-tender Skin:  No lesions in the area of chief complaint, no erythema Neurologic:  Sensation intact distally, CN grossly intact Lymphatic:  No axillary or cervical lymphadenopathy  Orthopedic Exam:  RLE: Able to wiggle toes SILT over toes Foot wwp Splint in place and is clean and dry.   Imaging:  As above: Right spiral distal tibial shaft fracture with minimally displaced posterior malleolar ankle  fracture  Assessment/Plan: [facility] Cory Ward is a 33 y.o. male with a Right spiral distal tibial shaft fracture with minimally displaced posterior malleolar ankle fracture   1. I discussed the various treatment options including both surgical and non-surgical management with the patient.  After discussion of risks, benefits, and alternatives to surgery, the the patient was in agreement to proceed with surgery. Plan for surgery is R intramedullary nailing of the tibia with ORIF of right posterior malleolus 2. NPO until OR 3. Hold anticoagulation in advance of OR 4.  P.o. plus IV for pain as needed 5.  Keep leg elevated.  Nonweightbearing on right lower extremity in splint.  Leim Fabry   08/17/2019 11:38 AM

## 2019-08-17 NOTE — Anesthesia Postprocedure Evaluation (Signed)
Anesthesia Post Note  Patient: [facility]  Procedure(s) Performed: INTRAMEDULLARY (IM) NAIL TIBIAL (Right Leg Lower)  Patient location during evaluation: PACU Anesthesia Type: General Level of consciousness: awake and alert Pain management: pain level controlled Vital Signs Assessment: post-procedure vital signs reviewed and stable Respiratory status: spontaneous breathing, nonlabored ventilation, respiratory function stable and patient connected to nasal cannula oxygen Cardiovascular status: blood pressure returned to baseline and stable Postop Assessment: no apparent nausea or vomiting Anesthetic complications: no     Last Vitals:  Vitals:   08/17/19 1742 08/17/19 1818  BP: (!) 157/109 (!) 150/102  Pulse: 67 68  Resp: 12   Temp:  36.6 C  SpO2: 98% 99%    Last Pain:  Vitals:   08/17/19 1818  TempSrc: Oral  PainSc:                  Precious Haws [place]it[place]

## 2019-08-17 NOTE — Anesthesia Post-op Follow-up Note (Signed)
Anesthesia QCDR form completed.        

## 2019-08-17 NOTE — Transfer of Care (Addendum)
Immediate Anesthesia Transfer of Care Note  Patient: Cory Ward  Procedure(s) Performed: INTRAMEDULLARY (IM) NAIL TIBIAL (Right Leg Lower)  Patient Location: PACU  Anesthesia Type:General  Level of Consciousness: awake and patient cooperative  Airway & Oxygen Therapy: Patient Spontanous Breathing and Patient connected to face mask oxygen  Post-op Assessment: Report given to RN and Post -op Vital signs reviewed and stable  Post vital signs: stable  Last Vitals:  Vitals Value Taken Time  BP 155/107 08/17/19 1629  Temp    Pulse 70 08/17/19 1632  Resp 13 08/17/19 1632  SpO2 100 % 08/17/19 1632  Vitals shown include unvalidated device data.  Last Pain:  Vitals:   08/17/19 1156  TempSrc: Tympanic  PainSc: 6       Patients Stated Pain Goal: 2 ([id number])  Complications: No apparent anesthesia complications

## 2019-08-17 NOTE — Plan of Care (Signed)

## 2019-08-17 NOTE — Anesthesia Procedure Notes (Signed)
Procedure Name: Intubation Date/Time: 08/17/2019 12:49 PM Performed by: Justus Memory, CRNA Pre-anesthesia Checklist: Patient identified, Patient being monitored, Timeout performed, Emergency Drugs available and Suction available Patient Re-evaluated:Patient Re-evaluated prior to induction Oxygen Delivery Method: Circle system utilized Preoxygenation: Pre-oxygenation with 100% oxygen Induction Type: IV induction Ventilation: Mask ventilation without difficulty Laryngoscope Size: Mac and 4 Grade View: Grade I Tube type: Oral Tube size: 7.0 mm Number of attempts: 1 Airway Equipment and Method: Stylet Placement Confirmation: ETT inserted through vocal cords under direct vision,  positive ETCO2 and breath sounds checked- equal and bilateral Secured at: 23 cm Tube secured with: Tape Dental Injury: Teeth and Oropharynx as per pre-operative assessment

## 2019-08-18 LAB — BASIC METABOLIC PANEL
Anion gap: 8 (ref 5–15)
BUN: 11 mg/dL (ref 6–20)
CO2: 23 mmol/L (ref 22–32)
Calcium: 8.6 mg/dL — ABNORMAL LOW (ref 8.9–10.3)
Chloride: 106 mmol/L (ref 98–111)
Creatinine, Ser: 1.03 mg/dL (ref 0.61–1.24)
GFR calc Af Amer: >60 mL/min (ref >60)
GFR calc non Af Amer: >60 mL/min (ref >60)
Glucose, Bld: 127 mg/dL — ABNORMAL HIGH (ref 70–99)
Potassium: 3.8 mmol/L (ref 3.5–5.1)
Sodium: 137 mmol/L (ref 135–145)

## 2019-08-18 LAB — CBC
HCT: 36 % — ABNORMAL LOW (ref 39.0–52.0)
Hemoglobin: 12.5 g/dL — ABNORMAL LOW (ref 13.0–17.0)
MCH: 31.1 pg (ref 26.0–34.0)
MCHC: 34.7 g/dL (ref 30.0–36.0)
MCV: 89.6 fL (ref 80.0–100.0)
Platelets: 254 10*3/uL (ref 150–400)
RBC: 4.02 MIL/uL — ABNORMAL LOW (ref 4.22–5.81)
RDW: 11.5 % (ref 11.5–15.5)
WBC: 12.4 10*3/uL — ABNORMAL HIGH (ref 4.0–10.5)
nRBC: 0 % (ref 0.0–0.2)

## 2019-08-18 LAB — HIV ANTIBODY (ROUTINE TESTING W REFLEX): HIV Screen 4th Generation wRfx: NONREACTIVE

## 2019-08-18 MED ORDER — ASPIRIN 325 MG PO TBEC: 325.0000 mg | DELAYED_RELEASE_TABLET | Freq: Every day | ORAL | 0 refills | Status: AC

## 2019-08-18 MED ORDER — TRAMADOL HCL 50 MG PO TABS: 50.0000 mg | ORAL_TABLET | Freq: Four times a day (QID) | ORAL | 1 refills | Status: DC | PRN

## 2019-08-18 MED ORDER — OXYCODONE HCL 5 MG PO TABS: 5.0000 mg | ORAL_TABLET | ORAL | 0 refills | Status: DC | PRN

## 2019-08-18 MED ORDER — ONDANSETRON HCL 4 MG PO TABS: 4.0000 mg | ORAL_TABLET | Freq: Four times a day (QID) | ORAL | 0 refills | Status: DC | PRN

## 2019-08-18 NOTE — TOC Transition Note (Signed)
Transition of Care [facility]) - CM/SW Discharge Note   Patient Details  Name: Cory Ward MRN: [id number] Date of Birth: May 16, 1986  [facility] CM/SW Contact:  Su Hilt, RN Phone Number: 08/18/2019, 12:28 PM   Clinical Narrative:     Met with the patient to discuss Plan and needs His family provides transportation, he lives alone but his Brother is going to be staying with him for a while. He stated he has a WC At the house  He needs a 3 in 1 and crutches. I notified Brad with [employer] that he needs charity for these items He needs PT HH and [facility] is notified being as it is [place] week [place] accepted the patient for PT  PCP is Dr Caryl Bis Get medication from [place] other needs  Final next level of care: [place] Barriers to Discharge: Barriers Resolved   Patient Goals and CMS Choice Patient states their goals for this hospitalization and ongoing recovery are:: go home      Discharge Placement                       Discharge Plan and Services   Discharge Planning Services: CM Consult            DME Arranged: 3-N-1, Crutches DME Agency: [facility] Date DME Agency Contacted: 08/18/19 Time DME Agency Contacted: 1227 Representative spoke with at DME Agency: [place]: PT [place]: [facility] [place] Date [place]: 08/18/19 Time [place]: 1227 Representative spoke with at [place]: [place] ([place]) Interventions     Readmission Risk Interventions No flowsheet data found.

## 2019-08-18 NOTE — Discharge Instructions (Signed)
INSTRUCTIONS AFTER Surgery  o Remove items at home which could result in a fall. This includes throw rugs or furniture in walking pathways o ICE to the affected joint every three hours while awake for 30 minutes at a time, for at least the first 3-5 days, and then as needed for pain and swelling.  Continue to use ice for pain and swelling. You may notice swelling that will progress down to the foot and ankle.  This is normal after surgery.  Elevate your leg when you are not up walking on it.   o Continue to use the breathing machine you got in the hospital (incentive spirometer) which will help keep your temperature down.  It is common for your temperature to cycle up and down following surgery, especially at night when you are not up moving around and exerting yourself.  The breathing machine keeps your lungs expanded and your temperature down.   DIET:  As you were doing prior to hospitalization, we recommend a well-balanced diet.  DRESSING / WOUND CARE / SHOWERING  Keep the dressing intact until follow-up in 2 weeks.  Do not shower.  ACTIVITY  o Increase activity slowly as tolerated, but follow the weight bearing instructions below.   o No driving for 6 weeks or until further direction given by your physician.  You cannot drive while taking narcotics.  o No lifting or carrying greater than 10 lbs. until further directed by your surgeon. o Avoid periods of inactivity such as sitting longer than an hour when not asleep. This helps prevent blood clots.  o You may return to work once you are authorized by your doctor.     WEIGHT BEARING  Nonweightbearing on the right leg for 2 weeks.  After the follow-up appointment may increase to partial weight-bear.   EXERCISES Gait training and ambulation with nonweightbearing with crutches or a walker.  CONSTIPATION  Constipation is defined medically as fewer than three stools per week and severe constipation as less than one stool per week.  Even  if you have a regular bowel pattern at home, your normal regimen is likely to be disrupted due to multiple reasons following surgery.  Combination of anesthesia, postoperative narcotics, change in appetite and fluid intake all can affect your bowels.   YOU MUST use at least one of the following options; they are listed in order of increasing strength to get the job done.  They are all available over the counter, and you may need to use some, POSSIBLY even all of these options:    Drink plenty of fluids (prune juice may be helpful) and high fiber foods Colace 100 mg by mouth twice a day  Senokot for constipation as directed and as needed Dulcolax (bisacodyl), take with full glass of water  Miralax (polyethylene glycol) once or twice a day as needed.  If you have tried all these things and are unable to have a bowel movement in the first 3-4 days after surgery call either your surgeon or your primary doctor.    If you experience loose stools or diarrhea, hold the medications until you stool forms back up.  If your symptoms do not get better within 1 week or if they get worse, check with your doctor.  If you experience "the worst abdominal pain ever" or develop nausea or vomiting, please contact the office immediately for further recommendations for treatment.   ITCHING:  If you experience itching with your medications, try taking only a single pain pill,  or even half a pain pill at a time.  You can also use Benadryl over the counter for itching or also to help with sleep.   TED HOSE STOCKINGS:  Use stockings on both legs until for at least 2 weeks or as directed by physician office. They may be removed at night for sleeping.  MEDICATIONS:  See your medication summary on the After Visit Summary that nursing will review with you.  You may have some home medications which will be placed on hold until you complete the course of blood thinner medication.  It is important for you to complete the blood  thinner medication as prescribed.  PRECAUTIONS:  If you experience chest pain or shortness of breath - call 911 immediately for transfer to the hospital emergency department.   If you develop a fever greater that 101 F, purulent drainage from wound, increased redness or drainage from wound, foul odor from the wound/dressing, or calf pain - CONTACT YOUR SURGEON.                                                   FOLLOW-UP APPOINTMENTS:  If you do not already have a post-op appointment, please call the office for an appointment to be seen by your surgeon.  Guidelines for how soon to be seen are listed in your After Visit Summary, but are typically between 1-4 weeks after surgery.  OTHER INSTRUCTIONS:   Aspirin 325 mg daily.  MAKE SURE YOU:   Understand these instructions.   Get help right away if you are not doing well or get worse.    Thank you for letting us be a part of your medical care team.  It is a privilege we respect greatly.  We hope these instructions will help you stay on track for a fast and full recovery!

## 2019-08-18 NOTE — Plan of Care (Signed)
  Problem: Education: Goal: Knowledge of General Education information will improve Description: Including pain rating scale, medication(s)/side effects and non-pharmacologic comfort measures 08/18/2019 1009 by Milderd Meager, RN Outcome: Progressing 08/18/2019 1008 by Milderd Meager, RN Outcome: Not Progressing   Problem: Health Behavior/Discharge Planning: Goal: Ability to manage health-related needs will improve 08/18/2019 1009 by Milderd Meager, RN Outcome: Progressing 08/18/2019 1008 by Milderd Meager, RN Outcome: Not Progressing   Problem: Clinical Measurements: Goal: Ability to maintain clinical measurements within normal limits will improve 08/18/2019 1009 by Milderd Meager, RN Outcome: Progressing 08/18/2019 1008 by Milderd Meager, RN Outcome: Not Progressing Goal: Will remain free from infection 08/18/2019 1009 by Milderd Meager, RN Outcome: Progressing 08/18/2019 1008 by Milderd Meager, RN Outcome: Not Progressing Goal: Diagnostic test results will improve 08/18/2019 1009 by Milderd Meager, RN Outcome: Progressing 08/18/2019 1008 by Milderd Meager, RN Outcome: Not Progressing Goal: Respiratory complications will improve 08/18/2019 1009 by Milderd Meager, RN Outcome: Progressing 08/18/2019 1008 by Milderd Meager, RN Outcome: Not Progressing Goal: Cardiovascular complication will be avoided 08/18/2019 1009 by Milderd Meager, RN Outcome: Progressing 08/18/2019 1008 by Milderd Meager, RN Outcome: Not Progressing   Problem: Activity: Goal: Risk for activity intolerance will decrease 08/18/2019 1009 by Milderd Meager, RN Outcome: Progressing 08/18/2019 1008 by Milderd Meager, RN Outcome: Not Progressing   Problem: Nutrition: Goal: Adequate nutrition will be maintained 08/18/2019 1009 by Milderd Meager, RN Outcome: Progressing 08/18/2019 1008 by Milderd Meager, RN Outcome: Not Progressing   Problem:  Coping: Goal: Level of anxiety will decrease 08/18/2019 1009 by Milderd Meager, RN Outcome: Progressing 08/18/2019 1008 by Milderd Meager, RN Outcome: Not Progressing   Problem: Elimination: Goal: Will not experience complications related to bowel motility 08/18/2019 1009 by Milderd Meager, RN Outcome: Progressing 08/18/2019 1008 by Milderd Meager, RN Outcome: Not Progressing Goal: Will not experience complications related to urinary retention 08/18/2019 1009 by Milderd Meager, RN Outcome: Progressing 08/18/2019 1008 by Milderd Meager, RN Outcome: Not Progressing   Problem: Pain Managment: Goal: General experience of comfort will improve 08/18/2019 1009 by Milderd Meager, RN Outcome: Progressing 08/18/2019 1008 by Milderd Meager, RN Outcome: Not Progressing   Problem: Safety: Goal: Ability to remain free from injury will improve 08/18/2019 1009 by Milderd Meager, RN Outcome: Progressing 08/18/2019 1008 by Milderd Meager, RN Outcome: Not Progressing   Problem: Skin Integrity: Goal: Risk for impaired skin integrity will decrease 08/18/2019 1009 by Milderd Meager, RN Outcome: Progressing 08/18/2019 1008 by Milderd Meager, RN Outcome: Not Progressing   Problem: Education: Goal: Required Educational Video(s) 08/18/2019 1009 by Milderd Meager, RN Outcome: Progressing 08/18/2019 1008 by Milderd Meager, RN Outcome: Not Progressing   Problem: Clinical Measurements: Goal: Postoperative complications will be avoided or minimized 08/18/2019 1009 by Milderd Meager, RN Outcome: Progressing 08/18/2019 1008 by Milderd Meager, RN Outcome: Not Progressing   Problem: Skin Integrity: Goal: Demonstration of wound healing without infection will improve 08/18/2019 1009 by Milderd Meager, RN Outcome: Progressing 08/18/2019 1008 by Milderd Meager, RN Outcome: Not Progressing

## 2019-08-18 NOTE — Evaluation (Signed)
Occupational Therapy Evaluation Patient Details Name: Cory Ward MRN: [id number] DOB: 1986/12/07 Today's Date: 08/18/2019    History of Present Illness 33 y.o. male who sustained a twisting injury to his right leg while he was rollerblading with PMH pneumonia, fluttering heart, and hemorrhoids. X-rays in the emergency department show a right spiral distal tibial shaft fracture with CT scan showing a minimally displaced posterior malleolus fracture. Dx: R tibia/posterior malleolus/proximal fibula fracture. Now s/p: Intramedullary nailing of right tibia, ORIF right ankle (posterior malleolus), Closed management of right proximal fibula fracture.   Clinical Impression   Pt seen for OT evaluation this date. 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  Pt reports his mother and brother will be able to assist as needed upon return home.  Currently pt demonstrates impairments in RLE functional use, strength, pain, and ROM requiring PRN Min A for LB ADL and supervision for functional mobility using underarm crutches. Pt instructed in AE/DME, home/routines modifications, and falls prevention strategies. Pt verbalized understanding denied additional OT needs. Will sign off. Please re-consult if additional needs arise.    Follow Up Recommendations  No OT follow up    Equipment Recommendations  3 in 1 bedside commode;Other (comment)(reacher)    Recommendations for Other Services       Precautions / Restrictions Precautions Precautions: Fall Required Braces or Orthoses: Splint/Cast Splint/Cast: R LE below knee Restrictions Weight Bearing Restrictions: Yes RLE Weight Bearing: Non weight bearing      Mobility Bed Mobility Overal bed mobility: Modified Independent(supine to sit; sit to supine)             General bed mobility comments: increase time and effort  Transfers Overall transfer level: Needs assistance Equipment used:  Crutches Transfers: Sit to/from Stand Sit to Stand: Supervision;Modified independent (Device/Increase time)         General transfer comment: Vc's for hand and BLE placement with crutches. Able to stand up with mild postural sway but able to find balance by himself    Balance Overall balance assessment: Modified Independent                                         ADL either performed or assessed with clinical judgement   ADL Overall ADL's : Needs assistance/impaired                                       General ADL Comments: PRN Min  A for LB bathing/dressing, supervision for toilet transfers to [facility] over toilet     Vision Baseline Vision/History: No visual deficits Patient Visual Report: No change from baseline       Perception     Praxis      Pertinent Vitals/Pain Pain Assessment: 0-10 Pain Score: 6  Pain Location: RLE Pain Descriptors / Indicators: Burning;Aching;Constant Pain Intervention(s): Limited activity within patient's tolerance;Monitored during session     Hand Dominance Right   Extremity/Trunk Assessment Upper Extremity Assessment Upper Extremity Assessment: Overall WFL for tasks assessed   Lower Extremity Assessment Lower Extremity Assessment: Overall WFL for tasks assessed;RLE deficits/detail;Defer to PT evaluation RLE Deficits / Details: Surgery site; partial active R hip/knee flexion due to pain at R LE surgical site RLE: Unable to fully assess due to pain;Unable to  fully assess due to immobilization   Cervical / Trunk Assessment Cervical / Trunk Assessment: Normal   Communication Communication Communication: No difficulties   Cognition Arousal/Alertness: Awake/alert Behavior During Therapy: WFL for tasks assessed/performed Overall Cognitive Status: Within Functional Limits for tasks assessed                                     General Comments  dressing is intact at the beginning/end of the  session at R LE    Exercises Other Exercises Other Exercises: Pt instructed in AE/DME, home/routines modifications, and falls prevention strategies   Shoulder Instructions      Home Living Family/patient expects to be discharged to:: Private residence Living Arrangements: Parent;Other relatives Available Help at Discharge: Family Type of Home: House Home Access: Stairs to enter [employer] of Steps: 3 Entrance Stairs-Rails: None Home Layout: One level     Bathroom Shower/Tub: Teacher, early years/pre: Handicapped height Bathroom Accessibility: Yes   Home Equipment: Grab bars - tub/shower;Wheelchair - manual   Additional Comments: W/C from his grandma      Prior Functioning/Environment Level of Independence: Independent                 OT Problem List: Pain;Decreased strength;Decreased range of motion      OT Treatment/Interventions:      OT Goals(Current goals can be found in the care plan section) Acute Rehab OT Goals Patient Stated Goal: to go home OT Goal Formulation: All assessment and education complete, DC therapy  OT Frequency:     Barriers to D/C:            Co-evaluation              AM-PAC OT "6 Clicks" Daily Activity     Outcome Measure Help from another person eating meals?: None Help from another person taking care of personal grooming?: None Help from another person toileting, which includes using toliet, bedpan, or urinal?: A Little Help from another person bathing (including washing, rinsing, drying)?: A Little Help from another person to put on and taking off regular upper body clothing?: None Help from another person to put on and taking off regular lower body clothing?: A Little 6 Click Score: 21   End of Session    Activity Tolerance: Patient tolerated treatment well Patient left: in bed;with call bell/phone within reach;with bed alarm set  OT Visit Diagnosis: Other abnormalities of gait and mobility  (R26.89)                Time: 1116-1130 OT Time Calculation (min): 14 min Charges:  OT General Charges $OT Visit: 1 Visit OT Evaluation $OT Eval Low Complexity: 1 Low OT Treatments $Self Care/Home Management : 8-22 mins  Jeni Salles, MPH, MS, OTR/L ascom [phone] 08/18/19, 1:00 PM

## 2019-08-18 NOTE — Plan of Care (Signed)
Problem: Education: Goal: Knowledge of General Education information will improve Description: Including pain rating scale, medication(s)/side effects and non-pharmacologic comfort measures 08/18/2019 1633 by Milderd Meager, RN Outcome: Adequate for Discharge 08/18/2019 1009 by Milderd Meager, RN Outcome: Progressing 08/18/2019 1008 by Milderd Meager, RN Outcome: Not Progressing   Problem: Health Behavior/Discharge Planning: Goal: Ability to manage health-related needs will improve 08/18/2019 1633 by Milderd Meager, RN Outcome: Adequate for Discharge 08/18/2019 1009 by Milderd Meager, RN Outcome: Progressing 08/18/2019 1008 by Milderd Meager, RN Outcome: Not Progressing   Problem: Clinical Measurements: Goal: Ability to maintain clinical measurements within normal limits will improve 08/18/2019 1633 by Milderd Meager, RN Outcome: Adequate for Discharge 08/18/2019 1009 by Milderd Meager, RN Outcome: Progressing 08/18/2019 1008 by Milderd Meager, RN Outcome: Not Progressing Goal: Will remain free from infection 08/18/2019 1633 by Milderd Meager, RN Outcome: Adequate for Discharge 08/18/2019 1009 by Milderd Meager, RN Outcome: Progressing 08/18/2019 1008 by Milderd Meager, RN Outcome: Not Progressing Goal: Diagnostic test results will improve 08/18/2019 1633 by Milderd Meager, RN Outcome: Adequate for Discharge 08/18/2019 1009 by Milderd Meager, RN Outcome: Progressing 08/18/2019 1008 by Milderd Meager, RN Outcome: Not Progressing Goal: Respiratory complications will improve 08/18/2019 1633 by Milderd Meager, RN Outcome: Adequate for Discharge 08/18/2019 1009 by Milderd Meager, RN Outcome: Progressing 08/18/2019 1008 by Milderd Meager, RN Outcome: Not Progressing Goal: Cardiovascular complication will be avoided 08/18/2019 1633 by Milderd Meager, RN Outcome: Adequate for Discharge 08/18/2019 1009 by Milderd Meager, RN Outcome: Progressing 08/18/2019 1008 by Milderd Meager, RN Outcome: Not Progressing   Problem: Activity: Goal: Risk for activity intolerance will decrease 08/18/2019 1633 by Milderd Meager, RN Outcome: Adequate for Discharge 08/18/2019 1009 by Milderd Meager, RN Outcome: Progressing 08/18/2019 1008 by Milderd Meager, RN Outcome: Not Progressing   Problem: Nutrition: Goal: Adequate nutrition will be maintained 08/18/2019 1633 by Milderd Meager, RN Outcome: Adequate for Discharge 08/18/2019 1009 by Milderd Meager, RN Outcome: Progressing 08/18/2019 1008 by Milderd Meager, RN Outcome: Not Progressing   Problem: Coping: Goal: Level of anxiety will decrease 08/18/2019 1633 by Milderd Meager, RN Outcome: Adequate for Discharge 08/18/2019 1009 by Milderd Meager, RN Outcome: Progressing 08/18/2019 1008 by Milderd Meager, RN Outcome: Not Progressing   Problem: Elimination: Goal: Will not experience complications related to bowel motility 08/18/2019 1633 by Milderd Meager, RN Outcome: Adequate for Discharge 08/18/2019 1009 by Milderd Meager, RN Outcome: Progressing 08/18/2019 1008 by Milderd Meager, RN Outcome: Not Progressing Goal: Will not experience complications related to urinary retention 08/18/2019 1633 by Milderd Meager, RN Outcome: Adequate for Discharge 08/18/2019 1009 by Milderd Meager, RN Outcome: Progressing 08/18/2019 1008 by Milderd Meager, RN Outcome: Not Progressing   Problem: Pain Managment: Goal: General experience of comfort will improve 08/18/2019 1633 by Milderd Meager, RN Outcome: Adequate for Discharge 08/18/2019 1009 by Milderd Meager, RN Outcome: Progressing 08/18/2019 1008 by Milderd Meager, RN Outcome: Not Progressing   Problem: Safety: Goal: Ability to remain free from injury will improve 08/18/2019 1633 by Milderd Meager, RN Outcome: Adequate for Discharge 08/18/2019  1009 by Milderd Meager, RN Outcome: Progressing 08/18/2019 1008 by Milderd Meager, RN Outcome: Not Progressing   Problem: Skin Integrity: Goal: Risk for impaired skin integrity will decrease 08/18/2019 1633 by Milderd Meager, RN Outcome: Adequate for Discharge 08/18/2019 1009  by Milderd Meager, RN Outcome: Progressing 08/18/2019 1008 by Milderd Meager, RN Outcome: Not Progressing   Problem: Education: Goal: Required Educational Video(s) 08/18/2019 1633 by Milderd Meager, RN Outcome: Adequate for Discharge 08/18/2019 1009 by Milderd Meager, RN Outcome: Progressing 08/18/2019 1008 by Milderd Meager, RN Outcome: Not Progressing   Problem: Clinical Measurements: Goal: Postoperative complications will be avoided or minimized 08/18/2019 1633 by Milderd Meager, RN Outcome: Adequate for Discharge 08/18/2019 1009 by Milderd Meager, RN Outcome: Progressing 08/18/2019 1008 by Milderd Meager, RN Outcome: Not Progressing   Problem: Skin Integrity: Goal: Demonstration of wound healing without infection will improve 08/18/2019 1633 by Milderd Meager, RN Outcome: Adequate for Discharge 08/18/2019 1009 by Milderd Meager, RN Outcome: Progressing 08/18/2019 1008 by Milderd Meager, RN Outcome: Not Progressing

## 2019-08-18 NOTE — Progress Notes (Signed)
  Subjective: 1 Day Post-Op Procedure(s) (LRB): INTRAMEDULLARY (IM) NAIL TIBIAL (Right) Patient reports pain as mild.   Patient seen in rounds with Dr. Posey Pronto. Patient is well, and has had no acute complaints or problems Plan is to go Home after hospital stay. Negative for chest pain and shortness of breath Fever: no Gastrointestinal: Negative for nausea and vomiting  Objective: Vital signs in last 24 hours: Temp:  [97.8 F (36.6 C)-98.7 F (37.1 C)] 98.3 F (36.8 C) (08/25 0346) Pulse Rate:  [55-87] 57 (08/25 0346) Resp:  [10-18] 18 (08/25 0346) BP: (127-186)/(81-112) 127/81 (08/25 0346) SpO2:  [94 %-100 %] 99 % (08/25 0346) FiO2 (%):  [6 %] 6 % (08/24 1630)  Intake/Output from previous day:  Intake/Output Summary (Last 24 hours) at 08/18/2019 0710 Last data filed at 08/18/2019 0635 Gross per 24 hour  Intake 2246.31 ml  Output 3675 ml  Net -1428.69 ml    Intake/Output this shift: No intake/output data recorded.  Labs: Recent Labs    08/16/19 1941 08/18/19 0423  HGB 14.9 12.5*   Recent Labs    08/16/19 1941 08/18/19 0423  WBC 7.7 12.4*  RBC 4.79 4.02*  HCT 42.8 36.0*  PLT 292 254   Recent Labs    08/16/19 1941 08/18/19 0423  NA 140 137  K 3.8 3.8  CL 105 106  CO2 22 23  BUN 14 11  CREATININE 1.17 1.03  GLUCOSE 109* 127*  CALCIUM 9.3 8.6*   Recent Labs    08/16/19 1941  INR 1.0     EXAM General - Patient is Alert and Oriented Extremity - Neurovascular intact Sensation intact distally Dressing/Incision - clean, dry, no drainage Motor Function - intact, moving foot and toes well on exam.   Past Medical History:  Diagnosis Date  . Elevated blood pressure reading   . Fluttering heart    SAW DR Rockey Situ ON 07-04-17-EKG DONE AND WAS WNL-PT TO F/U IF NEEDED  . Hemorrhoids   . Pneumonia 2014    Assessment/Plan: 1 Day Post-Op Procedure(s) (LRB): INTRAMEDULLARY (IM) NAIL TIBIAL (Right) Active Problems:   Tibia fracture  Estimated body mass  index is 25.48 kg/m as calculated from the following:   Height as of this encounter: 6\' 1"  (1.854 m).   Weight as of this encounter: 87.6 kg. Advance diet Up with therapy D/C IV fluids  Discharge home today.  Physical therapy before discharge. Nonweightbearing on the right.  DVT Prophylaxis - Aspirin and Foot Pumps   Reche Dixon, PA-C Orthopaedic Surgery 08/18/2019, 7:10 AM

## 2019-08-18 NOTE — Evaluation (Signed)
Physical Therapy Evaluation Patient Details Name: Cory Ward MRN: [id number] DOB: January 08, 1986 Today's Date: 08/18/2019   History of Present Illness  33 y.o. male who sustained a twisting injury to his right leg while he was rollerblading with PMH pneumonia, fluttering heart, and hemorrhoids. X-rays in the emergency department show a right spiral distal tibial shaft fracture with CT scan showing a minimally displaced posterior malleolus fracture. Dx: R tibia/posterior malleolus/proximal fibula fracture. Now s/p: Intramedullary nailing of right tibia, ORIF right ankle (posterior malleolus), Closed management of right proximal fibula fracture.  Clinical Impression  Prior to hospital admission, pt was independent.  Pt lives alone in a house with 3 steps to entry and no railings avaliable. Family able to provided assist after discharge.  Currently pt is modified independent with bed mobility and SBA to mod I with crutches for transfer/ambulations. Trialed x 4 methods with stairs negotiations and pt able to verbally teach back to use +2 assist with W/C transfer in/out the house. Pt states R knee pain with mobility and PT consult the ortho MD for R knee ROM restrictions. Per the physician, pt has no ROM restriction on the R knee. This Probation officer educated the pt regarding his concerning for his R knee pain and allowable ROM.  Pt would benefit from skilled PT to address noted impairments and functional limitations (see below for any additional details).  Upon hospital discharge, recommend pt discharge to [place] to address difficulty with stair negotiations, strength, and ROM to improve functional mobility and ADLs.     Follow Up Recommendations Home health PT    Equipment Recommendations  Crutches(tall crutches (pt is 6'1))    Recommendations for Other Services       Precautions / Restrictions Precautions Precautions: Fall Required Braces or Orthoses: Splint/Cast Splint/Cast: R LE below  knee Restrictions Weight Bearing Restrictions: Yes RLE Weight Bearing: Non weight bearing      Mobility  Bed Mobility Overal bed mobility: Modified Independent(supine to sit; sit to supine)             General bed mobility comments: increase time and effort  Transfers Overall transfer level: Needs assistance Equipment used: Crutches Transfers: Sit to/from Stand Sit to Stand: Supervision;Modified independent (Device/Increase time)         General transfer comment: Vc's for hand and BLE placement with crutches. Able to stand up with mild postural sway but able to find balance by himself  Ambulation/Gait Ambulation/Gait assistance: Modified independent (Device/Increase time);Supervision Gait Distance (Feet): (155 feet + stairs + 220 feet) Assistive device: Crutches   Gait velocity: decreased   General Gait Details: Vc's provided for foot placement with crutches. Fatigue noted at the end of the ambulation distance but able ambulate back to room and safely lower himself to the chair with cuing to the hand palcement.  Stairs Stairs: Yes Stairs assistance: Modified independent (Device/Increase time)   Number of Stairs: 2 General stair comments: Stair mangement trialed with crutches/railings/sitting/wheelchair. Pt unable to ambulate forward with cruches due to unable to flex R knee to avoid hitting the R foot with forward stairs. Pt felt unsafe using crutches ambulating backwards due to felt unstable. Pt able to use oneside of the Railing and one crutches for stairs x2 but pt doesn't has railing at home. Pt then trialed with sitting on the stairs sitting down but pt requires railing assist to lower himself down to the stairs safely. Pt finally educated on w/c with +2 assist for stair negotiation. Pt able to verbally teach  back with wheelchair transfer with +2 assist for getting up/down stairs.  Wheelchair Mobility    Modified Rankin (Stroke Patients Only)       Balance  Overall balance assessment: Modified Independent                                           Pertinent Vitals/Pain Pain Assessment: 0-10 Pain Score: 6  Pain Location: R LE pain increase with mobility up to 8/10. Nurse notified. Pain Descriptors / Indicators: Burning;Aching;Constant Pain Intervention(s): Monitored during session;Repositioned;Relaxation    Home Living Family/patient expects to be discharged to:: Private residence Living Arrangements: Parent;Other relatives Available Help at Discharge: Family Type of Home: House Home Access: Stairs to enter Entrance Stairs-Rails: None Entrance Stairs-Number of Steps: 3 Home Layout: One level Home Equipment: Grab bars - tub/shower;Wheelchair - manual Additional Comments: W/C from his grandma    Prior Function Level of Independence: Independent               Hand Dominance   Dominant Hand: Right    Extremity/Trunk Assessment   Upper Extremity Assessment Upper Extremity Assessment: Overall WFL for tasks assessed(5/5 with BUE grossly)    Lower Extremity Assessment Lower Extremity Assessment: Overall WFL for tasks assessed;RLE deficits/detail(5/5 with LLE grossly) RLE Deficits / Details: Surgery site; partial active R hip/knee flexion due to pain at R LE surgical site RLE: Unable to fully assess due to pain;Unable to fully assess due to immobilization       Communication   Communication: No difficulties  Cognition Arousal/Alertness: Awake/alert Behavior During Therapy: WFL for tasks assessed/performed Overall Cognitive Status: Within Functional Limits for tasks assessed                                        General Comments General comments (skin integrity, edema, etc.): dressing is intact at the beginning/end of the session at R LE    Exercises Total Joint Exercises Ankle Circles/Pumps: AROM;Strengthening;Left;10 reps;Supine Heel Slides: AROM;Strengthening;Left;10  reps;Supine Straight Leg Raises: AROM;Strengthening;Left;10 reps;Supine Long Arc Quad: AROM;Strengthening;Left;10 reps;Seated Knee Flexion: AAROM;Right;10 reps;Seated(limited ROM due to pain)   Assessment/Plan    PT Assessment Patient needs continued PT services  PT Problem List Decreased strength;Decreased range of motion;Decreased activity tolerance;Decreased balance;Decreased knowledge of use of DME;Decreased mobility       PT Treatment Interventions DME instruction;Gait training;Stair training;Functional mobility training;Therapeutic activities;Therapeutic exercise;Balance training    PT Goals (Current goals can be found in the Care Plan section)  Acute Rehab PT Goals Patient Stated Goal: to go home PT Goal Formulation: With patient Time For Goal Achievement: 09/02/19 Potential to Achieve Goals: Good    Frequency BID   Barriers to discharge        Co-evaluation               AM-PAC PT "6 Clicks" Mobility  Outcome Measure Help needed turning from your back to your side while in a flat bed without using bedrails?: None Help needed moving from lying on your back to sitting on the side of a flat bed without using bedrails?: None Help needed moving to and from a bed to a chair (including a wheelchair)?: None Help needed standing up from a chair using your arms (e.g., wheelchair or bedside chair)?: None Help needed to walk in hospital room?:  None Help needed climbing 3-5 steps with a railing? : A Lot 6 Click Score: 22    End of Session Equipment Utilized During Treatment: Gait belt Activity Tolerance: Patient tolerated treatment well;Patient limited by pain Patient left: in bed;with call bell/phone within reach;with bed alarm set;with SCD's reapplied(R LE heel elevated and heel floating off from pillow) Nurse Communication: Mobility status;Weight bearing status;Precautions PT Visit Diagnosis: Unsteadiness on feet (R26.81);History of falling (Z91.81);Difficulty in  walking, not elsewhere classified (R26.2)    Time: [id number] PT Time Calculation (min) (ACUTE ONLY): 55 min   Charges:               Sherrilyn Rist, SPT 08/18/2019, 11:46 AM

## 2019-08-18 NOTE — Discharge Summary (Signed)
Physician Discharge Summary  Subjective: 1 Day Post-Op Procedure(s) (LRB): INTRAMEDULLARY (IM) NAIL TIBIAL (Right) Patient reports pain as mild.   Patient seen in rounds with Dr. Posey Pronto. Patient is well, and has had no acute complaints or problems Patient is ready to go home after physical therapy  Physician Discharge Summary  Patient ID: Cory Ward MRN: [id number] DOB/AGE: 1986-04-22 33 y.o.  Admit date: 08/16/2019 Discharge date: 08/18/2019  Admission Diagnoses:  Discharge Diagnoses:  Active Problems:   Tibia fracture   Discharged Condition: good  Hospital Course: The patient is postop day 1 from a right tibia ORIF.  He is doing very well since surgery.  The patient is ready to work with physical therapy this morning.  His vitals are stable.  The patient will go home after physical therapy today.  Treatments: surgery:  1. Intramedullary nailing of right tibia  2.  ORIF right ankle (posterior malleolus) 3.  Closed management of right proximal fibula fracture  SURGEON: Cato Mulligan, MD  ASSISTANTS: none  EBL: 25cc  COMPONENTS:  [employer] T2 Nail: 9 x 394mm; 2 proximal interlocking screws; 3 distal interolocking screws.  4.0 mm fully threaded cancellous screw  Discharge Exam: Blood pressure 127/81, pulse (!) 57, temperature 98.3 F (36.8 C), temperature source Oral, resp. rate 18, height 6\' 1"  (1.854 m), weight 87.6 kg, SpO2 99 %.   Disposition: Discharge disposition: 01-Home or Self Care        Allergies as of 08/18/2019   No Known Allergies     Medication List    TAKE these medications   aspirin 325 MG EC tablet Take 1 tablet (325 mg total) by mouth daily. What changed:   medication strength  how much to take  when to take this  reasons to take this   ibuprofen 200 MG tablet Commonly known as: ADVIL Take 200-400 mg by mouth every 8 (eight) hours as needed for mild pain (depends on pain if takes 1-2 tablets).   ondansetron 4 MG  tablet Commonly known as: ZOFRAN Take 1 tablet (4 mg total) by mouth every 6 (six) hours as needed for nausea.   oxyCODONE 5 MG immediate release tablet Commonly known as: Oxy IR/ROXICODONE Take 1 tablet (5 mg total) by mouth every 4 (four) hours as needed for severe pain (pain score 7-10).   phenylephrine-shark liver oil-mineral oil-petrolatum 0.25-3-14-71.9 % rectal ointment Commonly known as: PREPARATION H Place 1 application rectally as needed for hemorrhoids.   Stool Softener 100 MG capsule Generic drug: docusate sodium Take 100 mg by mouth 2 (two) times daily.   traMADol 50 MG tablet Commonly known as: Ultram Take 1 tablet (50 mg total) by mouth every 6 (six) hours as needed. What changed: Another medication with the same name was added. Make sure you understand how and when to take each.   traMADol 50 MG tablet Commonly known as: ULTRAM Take 1 tablet (50 mg total) by mouth every 6 (six) hours as needed for moderate pain. What changed: You were already taking a medication with the same name, and this prescription was added. Make sure you understand how and when to take each.      Follow-up Information    Leim Fabry, MD. Schedule an appointment as soon as possible for a visit in 2 week(s).   Specialty: Orthopedic Surgery Contact information: [place] [id number] [phone]           Signed: Reche Dixon 08/18/2019, 7:17 AM   Objective: Vital  signs in last 24 hours: Temp:  [97.8 F (36.6 C)-98.7 F (37.1 C)] 98.3 F (36.8 C) (08/25 0346) Pulse Rate:  [55-87] 57 (08/25 0346) Resp:  [10-18] 18 (08/25 0346) BP: (127-186)/(81-112) 127/81 (08/25 0346) SpO2:  [94 %-100 %] 99 % (08/25 0346) FiO2 (%):  [6 %] 6 % (08/24 1630)  Intake/Output from previous day:  Intake/Output Summary (Last 24 hours) at 08/18/2019 0717 Last data filed at 08/18/2019 0635 Gross per 24 hour  Intake 2246.31 ml  Output 3675 ml  Net -1428.69 ml     Intake/Output this shift: No intake/output data recorded.  Labs: Recent Labs    08/16/19 1941 08/18/19 0423  HGB 14.9 12.5*   Recent Labs    08/16/19 1941 08/18/19 0423  WBC 7.7 12.4*  RBC 4.79 4.02*  HCT 42.8 36.0*  PLT 292 254   Recent Labs    08/16/19 1941 08/18/19 0423  NA 140 137  K 3.8 3.8  CL 105 106  CO2 22 23  BUN 14 11  CREATININE 1.17 1.03  GLUCOSE 109* 127*  CALCIUM 9.3 8.6*   Recent Labs    08/16/19 1941  INR 1.0    EXAM: General - Patient is Alert and Oriented Extremity - Neurovascular intact Sensation intact distally Incision - clean, dry, no drainage Motor Function -plantarflexion and dorsiflexion of his toes intact.  Assessment/Plan: 1 Day Post-Op Procedure(s) (LRB): INTRAMEDULLARY (IM) NAIL TIBIAL (Right) Procedure(s) (LRB): INTRAMEDULLARY (IM) NAIL TIBIAL (Right) Past Medical History:  Diagnosis Date  . Elevated blood pressure reading   . Fluttering heart    SAW DR Rockey Situ ON 07-04-17-EKG DONE AND WAS WNL-PT TO F/U IF NEEDED  . Hemorrhoids   . Pneumonia 2014   Active Problems:   Tibia fracture  Estimated body mass index is 25.48 kg/m as calculated from the following:   Height as of this encounter: 6\' 1"  (1.854 m).   Weight as of this encounter: 87.6 kg. Advance diet Up with therapy D/C IV fluids Diet - Regular diet Follow up - in 2 weeks Activity - NWB Disposition - Home Condition Upon Discharge - Good DVT Prophylaxis - Aspirin  Reche Dixon, PA-C Orthopaedic Surgery 08/18/2019, 7:17 AM

## 2019-08-19 ENCOUNTER — Telehealth: Payer: Self-pay | Admitting: Family Medicine

## 2019-08-19 NOTE — Telephone Encounter (Signed)
Transition Care Management Follow-up Telephone Call  How have you been since you were released from the hospital? Patient says he is on day 2 and feeling pretty well , home health will be in home today.   Do you understand why you were in the hospital? yes   Do you understand the discharge instrcutions? yes  Items Reviewed:  Medications reviewed: yes  Allergies reviewed: yes  Dietary changes reviewed: yes  Referrals reviewed: yes   Functional Questionnaire:   Activities of Daily Living (ADLs):   He states they are independent in the following: feeding, continence, grooming and toileting States they require assistance with the following: ambulation, bathing and hygiene and dressing   Any transportation issues/concerns?: no   Any patient concerns? no   Confirmed importance and date/time of follow-up visits scheduled: yes   Confirmed with patient if condition begins to worsen call PCP or go to the ER.  Patient was given the Call-a-Nurse line [phone]: yes

## 2019-08-24 ENCOUNTER — Ambulatory Visit (INDEPENDENT_AMBULATORY_CARE_PROVIDER_SITE_OTHER): Payer: Self-pay | Admitting: Family Medicine

## 2019-08-24 ENCOUNTER — Encounter: Payer: Self-pay | Admitting: Family Medicine

## 2019-08-24 ENCOUNTER — Other Ambulatory Visit: Payer: Self-pay

## 2019-08-24 DIAGNOSIS — S82242A Displaced spiral fracture of shaft of left tibia, initial encounter for closed fracture: Secondary | ICD-10-CM

## 2019-08-24 DIAGNOSIS — R030 Elevated blood-pressure reading, without diagnosis of hypertension: Secondary | ICD-10-CM

## 2019-08-24 DIAGNOSIS — K219 Gastro-esophageal reflux disease without esophagitis: Secondary | ICD-10-CM

## 2019-08-24 NOTE — Progress Notes (Signed)
Virtual Visit via video Note  This visit type was conducted due to national recommendations for restrictions regarding the COVID-19 pandemic (e.g. social distancing).  This format is felt to be most appropriate for this patient at this time.  All issues noted in this document were discussed and addressed.  No physical exam was performed (except for noted visual exam findings with Video Visits).   I connected with Cory Ward today at  2:45 PM EDT by a video enabled telemedicine application and verified that I am speaking with the correct person using two identifiers. Location patient: home Location provider: work  Persons participating in the virtual visit: patient, provider, Brother Cornelio Muncey)  I discussed the limitations, risks, security and privacy concerns of performing an evaluation and management service by telephone and the availability of in person appointments. I also discussed with the patient that there may be a patient responsible charge related to this service. The patient expressed understanding and agreed to proceed.  Interactive audio and video telecommunications were attempted between this provider and patient, however failed, due to patient having technical difficulties OR patient did not have access to video capability.  We continued and completed visit with audio only.   Reason for visit: tibia fracture, GERD, elevated blood pressure  HPI: Patient was hospitalized from 08/16/2019-08/18/2019.  He was hospitalized for right tibia fracture and right proximal fibula fracture.  He was rollerblading and fell onto his leg.  A friend drove him to the emergency room.  He was sent into a cast and then he had surgery.  He did well following surgery and was discharged.  He had open reduction internal fixation of right ankle and intramedullary nailing of the right tibia as well as closed management of the right proximal fibula fracture.  Since discharge he had been taking oxycodone every  4 hours for pain though ran out and has had trouble sleeping related to pain the last several nights.  Has been taking tramadol for this.  Physical therapy has been working with him.  The tramadol does help if he is not moving though if he moves he does have pain.  He is able to wiggle his toes and reports intact sensation.  He has been elevating and icing as well.  He is in the soft cast with a rigid splint posteriorly.  He reports blood pressure issues when coming out of surgery though physical therapy advised his blood pressure was normal when they came to evaluate him.  No chest pressure or shortness of breath.  He does note having had some heartburn on a couple of occasions that he describes as a dryness in his chest.  No sour taste.  No blood in his stool or abdominal pain.  No cough, wheezing, or fevers.  No exertional pain.  He would drink something and would resolve.  Discharge summary reviewed.  Medications reviewed.   ROS: See pertinent positives and negatives per HPI.  Past Medical History:  Diagnosis Date  . Elevated blood pressure reading   . Fluttering heart    SAW DR Rockey Situ ON 07-04-17-EKG DONE AND WAS WNL-PT TO F/U IF NEEDED  . Hemorrhoids   . Pneumonia 2014    Past Surgical History:  Procedure Laterality Date  . HEMORRHOID SURGERY N/A 09/23/2017   Procedure: HEMORRHOIDECTOMY;  Surgeon: Christene Lye, MD;  Location: [facility] ORS;  Service: General;  Laterality: N/A;  . RECTAL EXAM UNDER ANESTHESIA  09/23/2017   Procedure: RECTAL EXAM UNDER ANESTHESIA;  Surgeon: Jamal Collin,  Andreas Newport, MD;  Location: [facility] ORS;  Service: General;;  . TIBIA IM NAIL INSERTION Right 08/17/2019   Procedure: INTRAMEDULLARY (IM) NAIL TIBIAL;  Surgeon: Leim Fabry, MD;  Location: [facility] ORS;  Service: Orthopedics;  Laterality: Right;  . WISDOM TOOTH EXTRACTION      Family History  Problem Relation Age of Onset  . Diabetes Maternal Grandmother   . Heart disease Maternal Grandfather     SOCIAL  HX: Smoker.   Current Outpatient Medications:  .  aspirin EC 325 MG EC tablet, Take 1 tablet (325 mg total) by mouth daily., Disp: 30 tablet, Rfl: 0 .  docusate sodium (STOOL SOFTENER) 100 MG capsule, Take 100 mg by mouth 2 (two) times daily., Disp: , Rfl:  .  ibuprofen (ADVIL,MOTRIN) 200 MG tablet, Take 200-400 mg by mouth every 8 (eight) hours as needed for mild pain (depends on pain if takes 1-2 tablets)., Disp: , Rfl:  .  ondansetron (ZOFRAN) 4 MG tablet, Take 1 tablet (4 mg total) by mouth every 6 (six) hours as needed for nausea., Disp: 20 tablet, Rfl: 0 .  oxyCODONE (OXY IR/ROXICODONE) 5 MG immediate release tablet, Take 1 tablet (5 mg total) by mouth every 4 (four) hours as needed for severe pain (pain score 7-10)., Disp: 30 tablet, Rfl: 0 .  phenylephrine-shark liver oil-mineral oil-petrolatum (PREPARATION H) 0.25-3-14-71.9 % rectal ointment, Place 1 application rectally as needed for hemorrhoids., Disp: , Rfl:  .  traMADol (ULTRAM) 50 MG tablet, Take 1 tablet (50 mg total) by mouth every 6 (six) hours as needed for moderate pain., Disp: 30 tablet, Rfl: 1  EXAM:  VITALS per patient if applicable: None.  GENERAL: alert, oriented, appears well and in no acute distress  HEENT: atraumatic, conjunttiva clear, no obvious abnormalities on inspection of external nose and ears  NECK: normal movements of the head and neck  LUNGS: on inspection no signs of respiratory distress, breathing rate appears normal, no obvious gross SOB, gasping or wheezing  CV: no obvious cyanosis  MS: moves all visible extremities without noticeable abnormality  PSYCH/NEURO: pleasant and cooperative, no obvious depression or anxiety, speech and thought processing grossly intact  ASSESSMENT AND PLAN:  Discussed the following assessment and plan:  Tibia fracture Patient status post open reduction internal fixation.  He does have continued pain and does not have follow-up with orthopedics scheduled yet.   Advised that I would refill his oxycodone x1 and then further refills need to come from orthopedics.  He will call to schedule follow-up with them.  Discussed risk of addiction and drowsiness and respiratory depression with this medication.  Advised to seek medical attention if he developed drowsiness or respiratory issues.  Advised not to mix this with his tramadol.  He will continue to monitor his pain and work with physical therapy.  GERD (gastroesophageal reflux disease) Possible GERD.  We will trial Pepcid.  If not improving he will let us know.  Elevated BP without diagnosis of hypertension Patient notes this was elevated in the hospital though normal with physical therapy.  He will continue to have this monitored with physical therapy.  If trending up he will let us know.    I discussed the assessment and treatment plan with the patient. The patient was provided an opportunity to ask questions and all were answered. The patient agreed with the plan and demonstrated an understanding of the instructions.   The patient was advised to call back or seek an in-person evaluation if the symptoms worsen  or if the condition fails to improve as anticipated.   Tommi Rumps, MD

## 2019-08-25 DIAGNOSIS — R030 Elevated blood-pressure reading, without diagnosis of hypertension: Secondary | ICD-10-CM | POA: Insufficient documentation

## 2019-08-25 DIAGNOSIS — K219 Gastro-esophageal reflux disease without esophagitis: Secondary | ICD-10-CM | POA: Insufficient documentation

## 2019-08-25 MED ORDER — OXYCODONE HCL 5 MG PO TABS: 5.0000 mg | ORAL_TABLET | ORAL | 0 refills | Status: DC | PRN

## 2019-08-25 MED ORDER — OXYCODONE HCL 5 MG PO TABS: 5.0000 mg | ORAL_TABLET | ORAL | 0 refills | Status: AC | PRN

## 2019-08-25 NOTE — Assessment & Plan Note (Signed)
Patient status post open reduction internal fixation.  He does have continued pain and does not have follow-up with orthopedics scheduled yet.  Advised that I would refill his oxycodone x1 and then further refills need to come from orthopedics.  He will call to schedule follow-up with them.  Discussed risk of addiction and drowsiness and respiratory depression with this medication.  Advised to seek medical attention if he developed drowsiness or respiratory issues.  Advised not to mix this with his tramadol.  He will continue to monitor his pain and work with physical therapy.

## 2019-08-25 NOTE — Assessment & Plan Note (Signed)
Possible GERD.  We will trial Pepcid.  If not improving he will let us know.

## 2019-08-25 NOTE — Assessment & Plan Note (Addendum)
Patient notes this was elevated in the hospital though normal with physical therapy.  He will continue to have this monitored with physical therapy.  If trending up he will let us know.

## 2019-09-09 ENCOUNTER — Emergency Department: Payer: Self-pay

## 2019-09-09 ENCOUNTER — Emergency Department
Admission: EM | Admit: 2019-09-09 | Discharge: 2019-09-09 | Disposition: A | Discharge status: Home / Self Care | Payer: Self-pay | Attending: Emergency Medicine | Admitting: Emergency Medicine

## 2019-09-09 ENCOUNTER — Other Ambulatory Visit: Payer: Self-pay

## 2019-09-09 DIAGNOSIS — Z79899 Other long term (current) drug therapy: Secondary | ICD-10-CM | POA: Insufficient documentation

## 2019-09-09 DIAGNOSIS — E8729 Other acidosis: Secondary | ICD-10-CM

## 2019-09-09 DIAGNOSIS — F12188 Cannabis abuse with other cannabis-induced disorder: Secondary | ICD-10-CM | POA: Insufficient documentation

## 2019-09-09 DIAGNOSIS — E872 Acidosis: Secondary | ICD-10-CM | POA: Insufficient documentation

## 2019-09-09 DIAGNOSIS — F17210 Nicotine dependence, cigarettes, uncomplicated: Secondary | ICD-10-CM | POA: Insufficient documentation

## 2019-09-09 LAB — COMPREHENSIVE METABOLIC PANEL
ALT: 23 U/L (ref 0–44)
AST: 21 U/L (ref 15–41)
Albumin: 5.3 g/dL — ABNORMAL HIGH (ref 3.5–5.0)
Alkaline Phosphatase: 32 U/L — ABNORMAL LOW (ref 38–126)
Anion gap: 18 — ABNORMAL HIGH (ref 5–15)
BUN: 21 mg/dL — ABNORMAL HIGH (ref 6–20)
CO2: 23 mmol/L (ref 22–32)
Calcium: 10.6 mg/dL — ABNORMAL HIGH (ref 8.9–10.3)
Chloride: 99 mmol/L (ref 98–111)
Creatinine, Ser: 1.31 mg/dL — ABNORMAL HIGH (ref 0.61–1.24)
GFR calc Af Amer: >60 mL/min (ref >60)
GFR calc non Af Amer: >60 mL/min (ref >60)
Glucose, Bld: 210 mg/dL — ABNORMAL HIGH (ref 70–99)
Potassium: 3.4 mmol/L — ABNORMAL LOW (ref 3.5–5.1)
Sodium: 140 mmol/L (ref 135–145)
Total Bilirubin: 1.2 mg/dL (ref 0.3–1.2)
Total Protein: 8.8 g/dL — ABNORMAL HIGH (ref 6.5–8.1)

## 2019-09-09 LAB — CBC WITH DIFFERENTIAL/PLATELET
Abs Immature Granulocytes: 0.19 10*3/uL — ABNORMAL HIGH (ref 0.00–0.07)
Basophils Absolute: 0.1 10*3/uL (ref 0.0–0.1)
Basophils Relative: 1 %
Eosinophils Absolute: 0 10*3/uL (ref 0.0–0.5)
Eosinophils Relative: 0 %
HCT: 42.2 % (ref 39.0–52.0)
Hemoglobin: 14.8 g/dL (ref 13.0–17.0)
Immature Granulocytes: 1 %
Lymphocytes Relative: 8 %
Lymphs Abs: 1.4 10*3/uL (ref 0.7–4.0)
MCH: 30.1 pg (ref 26.0–34.0)
MCHC: 35.1 g/dL (ref 30.0–36.0)
MCV: 85.9 fL (ref 80.0–100.0)
Monocytes Absolute: 1.3 10*3/uL — ABNORMAL HIGH (ref 0.1–1.0)
Monocytes Relative: 8 %
Neutro Abs: 13.8 10*3/uL — ABNORMAL HIGH (ref 1.7–7.7)
Neutrophils Relative %: 82 %
Platelets: 488 10*3/uL — ABNORMAL HIGH (ref 150–400)
RBC: 4.91 MIL/uL (ref 4.22–5.81)
RDW: 11.4 % — ABNORMAL LOW (ref 11.5–15.5)
WBC: 16.8 10*3/uL — ABNORMAL HIGH (ref 4.0–10.5)
nRBC: 0 % (ref 0.0–0.2)

## 2019-09-09 LAB — BASIC METABOLIC PANEL
Anion gap: 14 (ref 5–15)
BUN: 17 mg/dL (ref 6–20)
CO2: 23 mmol/L (ref 22–32)
Calcium: 9.6 mg/dL (ref 8.9–10.3)
Chloride: 104 mmol/L (ref 98–111)
Creatinine, Ser: 1.07 mg/dL (ref 0.61–1.24)
GFR calc Af Amer: >60 mL/min (ref >60)
GFR calc non Af Amer: >60 mL/min (ref >60)
Glucose, Bld: 186 mg/dL — ABNORMAL HIGH (ref 70–99)
Potassium: 3.4 mmol/L — ABNORMAL LOW (ref 3.5–5.1)
Sodium: 141 mmol/L (ref 135–145)

## 2019-09-09 LAB — URINE DRUG SCREEN, QUALITATIVE (ARMC ONLY)
Amphetamines, Ur Screen: NOT DETECTED
Barbiturates, Ur Screen: NOT DETECTED
Benzodiazepine, Ur Scrn: NOT DETECTED
Cannabinoid 50 Ng, Ur ~~LOC~~: POSITIVE — AB
Cocaine Metabolite,Ur ~~LOC~~: NOT DETECTED
MDMA (Ecstasy)Ur Screen: NOT DETECTED
Methadone Scn, Ur: NOT DETECTED
Opiate, Ur Screen: POSITIVE — AB
Phencyclidine (PCP) Ur S: NOT DETECTED
Tricyclic, Ur Screen: NOT DETECTED

## 2019-09-09 LAB — LIPASE, BLOOD: Lipase: 34 U/L (ref 11–51)

## 2019-09-09 LAB — ETHANOL: Alcohol, Ethyl (B): <10 mg/dL (ref <10)

## 2019-09-09 LAB — TROPONIN I (HIGH SENSITIVITY)
Troponin I (High Sensitivity): 3 ng/L (ref <18)
Troponin I (High Sensitivity): <2 ng/L (ref <18)

## 2019-09-09 MED ORDER — IOHEXOL 350 MG/ML SOLN
100.0000 mL | Freq: Once | INTRAVENOUS | Status: AC | PRN
  Administered 2019-09-09: 100 mL via INTRAVENOUS

## 2019-09-09 MED ORDER — HALOPERIDOL LACTATE 5 MG/ML IJ SOLN
2.0000 mg | Freq: Once | INTRAMUSCULAR | Status: AC
  Administered 2019-09-09: 05:00:00 2 mg via INTRAVENOUS
  Filled 2019-09-09: qty 1

## 2019-09-09 MED ORDER — FAMOTIDINE IN NACL 20-0.9 MG/50ML-% IV SOLN
20.0000 mg | Freq: Once | INTRAVENOUS | Status: AC
  Administered 2019-09-09: 01:00:00 20 mg via INTRAVENOUS
  Filled 2019-09-09: qty 50

## 2019-09-09 MED ORDER — ONDANSETRON HCL 4 MG/2ML IJ SOLN
INTRAMUSCULAR | Status: AC
  Filled 2019-09-09: qty 2

## 2019-09-09 MED ORDER — LIDOCAINE VISCOUS HCL 2 % MT SOLN
15.0000 mL | Freq: Once | OROMUCOSAL | Status: AC
  Administered 2019-09-09: 01:00:00 15 mL via ORAL
  Filled 2019-09-09: qty 15

## 2019-09-09 MED ORDER — ALUM & MAG HYDROXIDE-SIMETH 200-200-20 MG/5ML PO SUSP
30.0000 mL | Freq: Once | ORAL | Status: AC
  Administered 2019-09-09: 01:00:00 30 mL via ORAL
  Filled 2019-09-09: qty 30

## 2019-09-09 MED ORDER — ONDANSETRON HCL 4 MG/2ML IJ SOLN
4.0000 mg | Freq: Once | INTRAMUSCULAR | Status: AC
  Administered 2019-09-09: 4 mg via INTRAVENOUS

## 2019-09-09 MED ORDER — ONDANSETRON HCL 4 MG/2ML IJ SOLN
4.0000 mg | Freq: Once | INTRAMUSCULAR | Status: AC
  Administered 2019-09-09: 4 mg via INTRAVENOUS
  Filled 2019-09-09: qty 2

## 2019-09-09 MED ORDER — MORPHINE SULFATE (PF) 4 MG/ML IV SOLN
4.0000 mg | Freq: Once | INTRAVENOUS | Status: AC
  Administered 2019-09-09: 01:00:00 4 mg via INTRAVENOUS

## 2019-09-09 MED ORDER — SODIUM CHLORIDE 0.9 % IV BOLUS
1000.0000 mL | Freq: Once | INTRAVENOUS | Status: AC
  Administered 2019-09-09: 1000 mL via INTRAVENOUS

## 2019-09-09 MED ORDER — MORPHINE SULFATE (PF) 4 MG/ML IV SOLN
INTRAVENOUS | Status: AC
  Filled 2019-09-09: qty 1

## 2019-09-09 MED ORDER — ONDANSETRON 4 MG PO TBDP: 4.0000 mg | ORAL_TABLET | Freq: Three times a day (TID) | ORAL | 0 refills | Status: DC | PRN

## 2019-09-09 NOTE — Discharge Instructions (Signed)
Drink lots of fluids.  Do a bland diet and avoid alcohol and marijuana for at least 48 hours.  Take Zofran as needed for nausea.  Drink lots of fluids to keep yourself hydrated.  Follow-up with your primary care doctor.  Return to the emergency room if you have chest pain, abdominal pain, fever, shortness of breath.

## 2019-09-09 NOTE — ED Provider Notes (Signed)
[facility] Emergency Department Provider Note  ____________________________________________  Time seen: Approximately 1:01 AM  I have reviewed the triage vital signs and the nursing notes.   HISTORY  Chief Complaint Chest Pain   HPI [facility] Sohm is a 33 y.o. male no significant past medical history who presents for evaluation of chest pain.  Patient reports a similar episode although not as intense last night that lasted for several minutes.  This afternoon patient reports that he developed sudden onset of burning chest pain associated with shortness of breath, severe nausea, nonbloody nonbilious emesis.  He is 3 weeks post intramedullary tibial nail insertion.  Not on blood thinners.  Patient is clammy, pale and diaphoretic.  Denies personal or family history of heart problems, PE or DVT, leg pain or swelling, hemoptysis.  He denies abdominal pain.  Has had some diarrhea today as well.  He is a smoker and drinks alcohol daily. Denies similar pain in the past. Not currently on narcotics. Had 3 drinks today. Daily MJ smoker has increased his daily usage and also doing edibles to control pain from recent surgery.  Past Medical History:  Diagnosis Date   Elevated blood pressure reading    Fluttering heart    SAW DR Rockey Situ ON 07-04-17-EKG DONE AND WAS WNL-PT TO F/U IF NEEDED   Hemorrhoids    Pneumonia 2014    Patient Active Problem List   Diagnosis Date Noted   GERD (gastroesophageal reflux disease) 08/25/2019   Elevated BP without diagnosis of hypertension 08/25/2019   Tibia fracture 08/16/2019   Smoker 07/04/2017   Hemorrhoids 06/06/2017   Palpitations 06/06/2017   Atypical nevus 06/06/2017   Nonintractable headache 06/06/2017    Past Surgical History:  Procedure Laterality Date   HEMORRHOID SURGERY N/A 09/23/2017   Procedure: HEMORRHOIDECTOMY;  Surgeon: Christene Lye, MD;  Location: [facility] ORS;  Service: General;  Laterality:  N/A;   RECTAL EXAM UNDER ANESTHESIA  09/23/2017   Procedure: RECTAL EXAM UNDER ANESTHESIA;  Surgeon: Christene Lye, MD;  Location: [facility] ORS;  Service: General;;   TIBIA IM NAIL INSERTION Right 08/17/2019   Procedure: INTRAMEDULLARY (IM) NAIL TIBIAL;  Surgeon: Leim Fabry, MD;  Location: [facility] ORS;  Service: Orthopedics;  Laterality: Right;   WISDOM TOOTH EXTRACTION      Prior to Admission medications   Medication Sig Start Date End Date Taking? Authorizing Provider  aspirin EC 325 MG EC tablet Take 1 tablet (325 mg total) by mouth daily. 08/18/19   Reche Dixon, PA-C  docusate sodium (STOOL SOFTENER) 100 MG capsule Take 100 mg by mouth 2 (two) times daily.    [provider]  ibuprofen (ADVIL,MOTRIN) 200 MG tablet Take 200-400 mg by mouth every 8 (eight) hours as needed for mild pain (depends on pain if takes 1-2 tablets).    [provider]  ondansetron (ZOFRAN ODT) 4 MG disintegrating tablet Take 1 tablet (4 mg total) by mouth every 8 (eight) hours as needed. 09/09/19   Rudene Re, MD  ondansetron (ZOFRAN) 4 MG tablet Take 1 tablet (4 mg total) by mouth every 6 (six) hours as needed for nausea. 08/18/19   Reche Dixon, PA-C  oxyCODONE (OXY IR/ROXICODONE) 5 MG immediate release tablet Take 1 tablet (5 mg total) by mouth every 4 (four) hours as needed for severe pain (pain score 7-10). 08/25/19 09/24/19  Leone Haven, MD  phenylephrine-shark liver oil-mineral oil-petrolatum (PREPARATION H) 0.25-3-14-71.9 % rectal ointment Place 1 application rectally as needed for hemorrhoids.  [provider]  traMADol (ULTRAM) 50 MG tablet Take 1 tablet (50 mg total) by mouth every 6 (six) hours as needed for moderate pain. 08/18/19   Reche Dixon, PA-C    Allergies Patient has no known allergies.  Family History  Problem Relation Age of Onset   Diabetes Maternal Grandmother    Heart disease Maternal Grandfather     Social History Social History   Tobacco  Use   Smoking status: Current Every Day Smoker    Packs/day: 0.25    Types: Cigarettes   Smokeless tobacco: Never Used   Tobacco comment: A pack will last him a week.   Substance Use Topics   Alcohol use: Yes    Comment: 2-3 BEERS DAILY   Drug use: Yes    Types: Marijuana    Comment: DAILY    Review of Systems  Constitutional: Negative for fever. Eyes: Negative for visual changes. ENT: Negative for sore throat. Neck: No neck pain  Cardiovascular: + burning chest pain. Respiratory: + shortness of breath. Gastrointestinal: Negative for abdominal pain. + Nausea,vomiting, and diarrhea. Genitourinary: Negative for dysuria. Musculoskeletal: Negative for back pain. Skin: Negative for rash. Neurological: Negative for headaches, weakness or numbness. Psych: No SI or HI  ____________________________________________   PHYSICAL EXAM:  VITAL SIGNS: ED Triage Vitals  Enc Vitals Group     BP 09/09/19 0038 138/89     Pulse Rate 09/09/19 0038 68     Resp 09/09/19 0038 19     Temp 09/09/19 0038 97.9 F (36.6 C)     Temp Source 09/09/19 0038 Oral     SpO2 09/09/19 0038 100 %     Weight 09/09/19 0035 190 lb (86.2 kg)     Height 09/09/19 0035 6\' 1"  (1.854 m)     Head Circumference --      Peak Flow --      Pain Score 09/09/19 0034 10     Pain Loc --      Pain Edu? --      Excl. in [place]? --     Constitutional: Alert and oriented, clammy, cool, diaphoretic.  HEENT:      Head: Normocephalic and atraumatic.         Eyes: Conjunctivae are normal. Sclera is non-icteric.       Mouth/Throat: Mucous membranes are moist.       Neck: Supple with no signs of meningismus. Cardiovascular: Regular rate and rhythm. No murmurs, gallops, or rubs. 2+ symmetrical distal pulses are present in all extremities. No JVD. Respiratory: Normal respiratory effort. Lungs are clear to auscultation bilaterally. No wheezes, crackles, or rhonchi.  Gastrointestinal: Soft, non tender, and non distended with  positive bowel sounds. No rebound or guarding. Musculoskeletal: Nontender with normal range of motion in all extremities. No edema, cyanosis, or erythema of extremities. Neurologic: Normal speech and language. Face is symmetric. Moving all extremities. No gross focal neurologic deficits are appreciated. Skin: Skin is warm, dry and intact. No rash noted. Psychiatric: Mood and affect are normal. Speech and behavior are normal.  ____________________________________________   LABS (all labs ordered are listed, but only abnormal results are displayed)  Labs Reviewed  CBC WITH DIFFERENTIAL/PLATELET - Abnormal; Notable for the following components:      Result Value   WBC 16.8 (*)    RDW 11.4 (*)    Platelets 488 (*)    Neutro Abs 13.8 (*)    Monocytes Absolute 1.3 (*)    Abs Immature Granulocytes 0.19 (*)  All other components within normal limits  COMPREHENSIVE METABOLIC PANEL - Abnormal; Notable for the following components:   Potassium 3.4 (*)    Glucose, Bld 210 (*)    BUN 21 (*)    Creatinine, Ser 1.31 (*)    Calcium 10.6 (*)    Total Protein 8.8 (*)    Albumin 5.3 (*)    Alkaline Phosphatase 32 (*)    Anion gap 18 (*)    All other components within normal limits  URINE DRUG SCREEN, QUALITATIVE ([facility] ONLY) - Abnormal; Notable for the following components:   Opiate, Ur Screen POSITIVE (*)    Cannabinoid 50 Ng, Ur  POSITIVE (*)    All other components within normal limits  BASIC METABOLIC PANEL - Abnormal; Notable for the following components:   Potassium 3.4 (*)    Glucose, Bld 186 (*)    All other components within normal limits  LIPASE, BLOOD  ETHANOL  TROPONIN I (HIGH SENSITIVITY)  TROPONIN I (HIGH SENSITIVITY)   ____________________________________________  EKG  ED ECG REPORT I, Rudene Re, the attending physician, personally viewed and interpreted this ECG.  Normal sinus rhythm, rate of 78, atypical right bundle branch block, borderline anterior  concave up ST elevations with no reciprocal changes.  Unchanged from prior.  00:22 -normal sinus rhythm with a rate of 83, unchanged minimal ST elevations anteriorly.  00:30 -normal sinus rhythm with rate of 73, unchanged minimal ST elevations anteriorly. ____________________________________________  RADIOLOGY  I have personally reviewed the images performed during this visit and I agree with the Radiologist's read.   Interpretation by Radiologist:  Dg Abdomen 1 View  Result Date: 09/09/2019 CLINICAL DATA:  Gastric attention on CTA chest EXAM: ABDOMEN - 1 VIEW COMPARISON:  Same day CTA chest FINDINGS: No significant gastric distention. Bowel gas pattern is nonobstructive. No radio-opaque calculi though evaluation is limited by the presence of excreted contrast media within the collecting system. Few scattered pelvic phleboliths. Small os acetabuli at the left hip. Osseous structures are otherwise unremarkable. IMPRESSION: Nonobstructive bowel gas pattern. Excreted contrast media in the urinary collecting system. Electronically Signed   By: Lovena Le M.D.   On: 09/09/2019 02:18   Ct Angio Chest Pe W And/or Wo Contrast  Result Date: 09/09/2019 CLINICAL DATA:  Chest pain and shortness of breath, 3 weeks postop right leg surgery. No history of anticoagulation. EXAM: CT ANGIOGRAPHY CHEST WITH CONTRAST TECHNIQUE: Multidetector CT imaging of the chest was performed using the standard protocol during bolus administration of intravenous contrast. Multiplanar CT image reconstructions and MIPs were obtained to evaluate the vascular anatomy. CONTRAST:  13mL OMNIPAQUE IOHEXOL 350 MG/ML SOLN COMPARISON:  Same day chest radiograph FINDINGS: Cardiovascular: Satisfactory opacification of the pulmonary arteries to the segmental level. No central, lobar or segmental filling defects. Central pulmonary artery is normal caliber. No elevation of the RV/LV ratio (0.85). Normal heart size. No pericardial effusion. The  aorta is normal caliber. Proximal great vessels are unremarkable. Mediastinum/Nodes: No enlarged mediastinal or axillary lymph nodes. Thyroid gland, trachea, and esophagus demonstrate no significant findings. Lungs/Pleura: No consolidation, features of edema, pneumothorax, or effusion. Insert no nodes Upper Abdomen: No acute abnormalities present in the visualized portions of the upper abdomen. Musculoskeletal: Mild pectus deformity of the chest (Haller index of 2.7). No acute or suspicious osseous lesion. Mild left gynecomastia. Review of the MIP images confirms the above findings. IMPRESSION: 1. No evidence of pulmonary embolism. No acute intrathoracic process. 2. Mild pectus excavatum. 3. Mild left gynecomastia. Electronically Signed  By: Lovena Le M.D.   On: 09/09/2019 02:14   Dg Chest Portable 1 View  Result Date: 09/09/2019 CLINICAL DATA:  33 year old male with chest pain and shortness of breath onset tonight. EXAM: PORTABLE CHEST 1 VIEW COMPARISON:  Portable chest 08/16/2019. FINDINGS: Portable AP upright view at 0022 hours. Lung volumes remain within normal limits. Mediastinal contours are stable and within normal limits. Visualized tracheal air column is within normal limits. Allowing for portable technique the lungs are clear. No pneumothorax. Negative visible bowel gas pattern. No acute osseous abnormality identified. IMPRESSION: Negative portable chest. Electronically Signed   By: Genevie Ann M.D.   On: 09/09/2019 00:46     ____________________________________________   PROCEDURES  Procedure(s) performed: None Procedures Critical Care performed:  None ____________________________________________   INITIAL IMPRESSION / ASSESSMENT AND PLAN / ED COURSE  33 y.o. male no significant past medical history who presents for evaluation of burning chest pain associated with nausea, diaphoresis, and SOB.  Patient arrives clammy, pale, diaphoretic, with normal vital signs.  Abdomen is soft with no  tenderness and no mass, pulses are equal x4, lungs are clear to auscultation.  EKG x3 with no ischemic changes.  Differential diagnosis including ACS versus PE versus GERD versus ulcer versus alcoholic gastritis versus pancreatitis.  Patient given morphine, Zofran and fluids.  Labs are pending.    _________________________ 6:13 AM on 09/09/2019 -----------------------------------------  Labs showing white count of 16 and platelets of 488 most likely stress-induced.  Elevated BG, hypercalcemia, and mildly elevatd creatinine most likely due to dehydration and EtOH abuse. Tox screen + for opiates and MJ. Imaging with no acute findings. After IVF, several rounds of anti-emetics, IV haldol, and pepcid patient now feels markedly improved. Patient received IVF and repeat BMP showed closed AG.  Patient looks extremely well otherwise.  Normal vital signs.  Tolerating p.o.  Most likely hyperemesis from marijuana abuse.  Recommended stay away from marijuana and alcohol and bland diet for at least 48 hours.  Will discharge patient home on Zofran.  Recommended follow-up with PCP.  Discussed my standard return precautions    As part of my medical decision making, I reviewed the following data within the [place] notes reviewed and incorporated, Labs reviewed , EKG interpreted , Old EKG reviewed, Old chart reviewed, Radiograph reviewed , Notes from prior ED visits and [place] Controlled Substance Database   Patient was evaluated in Emergency Department today for the symptoms described in the history of present illness. Patient was evaluated in the context of the global COVID-19 pandemic, which necessitated consideration that the patient might be at risk for infection with the SARS-CoV-2 virus that causes COVID-19. Institutional protocols and algorithms that pertain to the evaluation of patients at risk for COVID-19 are in a state of rapid change based on information released by regulatory  bodies including the CDC and federal and state organizations. These policies and algorithms were followed during the patient's care in the ED.   ____________________________________________   FINAL CLINICAL IMPRESSION(S) / ED DIAGNOSES   Final diagnoses:  Cannabis hyperemesis syndrome concurrent with and due to cannabis abuse ([place])  Alcoholic ketoacidosis      NEW MEDICATIONS STARTED DURING THIS VISIT:  ED Discharge Orders         Ordered    ondansetron (ZOFRAN ODT) 4 MG disintegrating tablet  Every 8 hours PRN     09/09/19 0618           Note:  This document was  prepared using Systems analyst and may include unintentional dictation errors.    Alfred Levins, [place], MD 09/09/19 [phone]

## 2019-09-09 NOTE — ED Triage Notes (Signed)
Patient coming in [facility] with chest pain and shortness of breath that started tonight. Pain 10/10. Patient had sudden onset tonight with vomiting. Patient is 3 weeks post op right leg surgery was not placed on any blood thinners. Patient pale and clammy.

## 2019-09-10 ENCOUNTER — Telehealth: Payer: Self-pay | Admitting: Family Medicine

## 2019-09-10 NOTE — Telephone Encounter (Signed)
Patient aware.

## 2019-09-10 NOTE — Telephone Encounter (Signed)
This medication would need to be refilled by his orthopedist. I refilled this previously following his surgery as he had not followed up with his orthopedic surgeon at that time though he has since seen them.

## 2019-09-10 NOTE — Telephone Encounter (Signed)
Last Refill:  08/25/19  Last OV:  08/24/19

## 2019-09-10 NOTE — Telephone Encounter (Signed)
Medication Refill - Medication: oxyCODONE (OXY IR/ROXICODONE) 5 MG immediate release tablet    Has the patient contacted their pharmacy? Yes.   Pt states he only has 3 pills left. Please advise.  (Agent: If no, request that the patient contact the pharmacy for the refill.) (Agent: If yes, when and what did the pharmacy advise?)  Preferred Pharmacy (with phone number or street name):  [facility], [place]  [place] [address]  Phone: [phone] Fax: [phone]  Not a 24 hour pharmacy; exact hours not known.     Agent: Please be advised that RX refills may take up to 3 business days. We ask that you follow-up with your pharmacy.

## 2019-09-11 ENCOUNTER — Emergency Department: Payer: Self-pay

## 2019-09-11 ENCOUNTER — Encounter: Payer: Self-pay | Admitting: Emergency Medicine

## 2019-09-11 ENCOUNTER — Other Ambulatory Visit: Payer: Self-pay

## 2019-09-11 ENCOUNTER — Observation Stay
Admission: EM | Admit: 2019-09-11 | Discharge: 2019-09-12 | Disposition: A | Discharge status: Home / Self Care | Payer: Self-pay | Attending: Internal Medicine | Admitting: Internal Medicine

## 2019-09-11 ENCOUNTER — Telehealth: Payer: Self-pay

## 2019-09-11 DIAGNOSIS — Z7982 Long term (current) use of aspirin: Secondary | ICD-10-CM | POA: Insufficient documentation

## 2019-09-11 DIAGNOSIS — Z79899 Other long term (current) drug therapy: Secondary | ICD-10-CM | POA: Insufficient documentation

## 2019-09-11 DIAGNOSIS — Z20828 Contact with and (suspected) exposure to other viral communicable diseases: Secondary | ICD-10-CM | POA: Insufficient documentation

## 2019-09-11 DIAGNOSIS — R079 Chest pain, unspecified: Principal | ICD-10-CM | POA: Diagnosis present

## 2019-09-11 DIAGNOSIS — Z8249 Family history of ischemic heart disease and other diseases of the circulatory system: Secondary | ICD-10-CM | POA: Insufficient documentation

## 2019-09-11 DIAGNOSIS — F17210 Nicotine dependence, cigarettes, uncomplicated: Secondary | ICD-10-CM | POA: Insufficient documentation

## 2019-09-11 DIAGNOSIS — R112 Nausea with vomiting, unspecified: Secondary | ICD-10-CM | POA: Diagnosis present

## 2019-09-11 DIAGNOSIS — R030 Elevated blood-pressure reading, without diagnosis of hypertension: Secondary | ICD-10-CM | POA: Diagnosis present

## 2019-09-11 DIAGNOSIS — I081 Rheumatic disorders of both mitral and tricuspid valves: Secondary | ICD-10-CM | POA: Insufficient documentation

## 2019-09-11 DIAGNOSIS — K219 Gastro-esophageal reflux disease without esophagitis: Secondary | ICD-10-CM | POA: Diagnosis present

## 2019-09-11 DIAGNOSIS — I420 Dilated cardiomyopathy: Secondary | ICD-10-CM | POA: Insufficient documentation

## 2019-09-11 LAB — CBC
HCT: 41.1 % (ref 39.0–52.0)
Hemoglobin: 14.9 g/dL (ref 13.0–17.0)
MCH: 30.2 pg (ref 26.0–34.0)
MCHC: 36.3 g/dL — ABNORMAL HIGH (ref 30.0–36.0)
MCV: 83.2 fL (ref 80.0–100.0)
Platelets: 440 10*3/uL — ABNORMAL HIGH (ref 150–400)
RBC: 4.94 MIL/uL (ref 4.22–5.81)
RDW: 11.3 % — ABNORMAL LOW (ref 11.5–15.5)
WBC: 11.1 10*3/uL — ABNORMAL HIGH (ref 4.0–10.5)
nRBC: 0 % (ref 0.0–0.2)

## 2019-09-11 LAB — HEPATIC FUNCTION PANEL
ALT: 38 U/L (ref 0–44)
AST: 27 U/L (ref 15–41)
Albumin: 5.1 g/dL — ABNORMAL HIGH (ref 3.5–5.0)
Alkaline Phosphatase: 33 U/L — ABNORMAL LOW (ref 38–126)
Bilirubin, Direct: 0.2 mg/dL (ref 0.0–0.2)
Indirect Bilirubin: 1.4 mg/dL — ABNORMAL HIGH (ref 0.3–0.9)
Total Bilirubin: 1.6 mg/dL — ABNORMAL HIGH (ref 0.3–1.2)
Total Protein: 8.2 g/dL — ABNORMAL HIGH (ref 6.5–8.1)

## 2019-09-11 LAB — BASIC METABOLIC PANEL
Anion gap: 16 — ABNORMAL HIGH (ref 5–15)
BUN: 12 mg/dL (ref 6–20)
CO2: 22 mmol/L (ref 22–32)
Calcium: 10.7 mg/dL — ABNORMAL HIGH (ref 8.9–10.3)
Chloride: 95 mmol/L — ABNORMAL LOW (ref 98–111)
Creatinine, Ser: 1.07 mg/dL (ref 0.61–1.24)
GFR calc Af Amer: >60 mL/min (ref >60)
GFR calc non Af Amer: >60 mL/min (ref >60)
Glucose, Bld: 122 mg/dL — ABNORMAL HIGH (ref 70–99)
Potassium: 3 mmol/L — ABNORMAL LOW (ref 3.5–5.1)
Sodium: 133 mmol/L — ABNORMAL LOW (ref 135–145)

## 2019-09-11 LAB — LIPASE, BLOOD: Lipase: 35 U/L (ref 11–51)

## 2019-09-11 LAB — TROPONIN I (HIGH SENSITIVITY)
Troponin I (High Sensitivity): 12 ng/L (ref <18)
Troponin I (High Sensitivity): 14 ng/L (ref <18)

## 2019-09-11 MED ORDER — HYDROMORPHONE HCL 1 MG/ML IJ SOLN
1.0000 mg | Freq: Once | INTRAMUSCULAR | Status: AC
  Administered 2019-09-11: 1 mg via INTRAVENOUS
  Filled 2019-09-11: qty 1

## 2019-09-11 MED ORDER — IOHEXOL 350 MG/ML SOLN
100.0000 mL | Freq: Once | INTRAVENOUS | Status: AC | PRN
  Administered 2019-09-11: 100 mL via INTRAVENOUS

## 2019-09-11 MED ORDER — SODIUM CHLORIDE 0.9% FLUSH: 3.0000 mL | Freq: Once | INTRAVENOUS | Status: DC

## 2019-09-11 MED ORDER — HALOPERIDOL LACTATE 5 MG/ML IJ SOLN
2.0000 mg | Freq: Once | INTRAMUSCULAR | Status: AC
  Administered 2019-09-11: 22:00:00 2 mg via INTRAVENOUS
  Filled 2019-09-11: qty 1

## 2019-09-11 MED ORDER — POTASSIUM CHLORIDE 10 MEQ/100ML IV SOLN
10.0000 meq | INTRAVENOUS | Status: AC
  Administered 2019-09-11 – 2019-09-12 (×2): 10 meq via INTRAVENOUS
  Filled 2019-09-11 (×2): qty 100

## 2019-09-11 MED ORDER — SODIUM CHLORIDE 0.9 % IV BOLUS
1000.0000 mL | Freq: Once | INTRAVENOUS | Status: AC
  Administered 2019-09-11: 22:00:00 1000 mL via INTRAVENOUS

## 2019-09-11 NOTE — ED Notes (Signed)
Patient transported to CT 

## 2019-09-11 NOTE — Telephone Encounter (Signed)
Copied from [place] [id number]. Topic: General - Inquiry >> Sep 11, 2019  3:44 PM Richardo Priest, [place] wrote: Reason for CRM: Patient called in stating he is still feeling sick from his ED visit 9/16, and cannot keep solid foods down. Patient is seeking recommendations. Please advise and call back is [phone].

## 2019-09-11 NOTE — ED Notes (Signed)
Pt states he feels something burning in his chest when he has a bowel movement. Pt with onset of abdominal pain, n/v three days ago. Pt states pain is worse than when he fractured his right leg. Pt with pwd skin.

## 2019-09-11 NOTE — ED Notes (Signed)
ED Provider Funk at bedside. 

## 2019-09-11 NOTE — Telephone Encounter (Signed)
Called and spoke to patient.  Patient agreeable to going back to ED to be re-evaluated.

## 2019-09-11 NOTE — H&P (Signed)
[facility] NAME: Cory Ward    MR#:  [id number]  DATE OF BIRTH:  Jan 10, 1986  DATE OF ADMISSION:  09/11/2019  PRIMARY CARE PHYSICIAN: Leone Haven, MD   REQUESTING/REFERRING PHYSICIAN: Jari Pigg, MD  CHIEF COMPLAINT:   Chief Complaint  Patient presents with  . Chest Pain  . Nausea    HISTORY OF PRESENT ILLNESS:  Cory Ward  is a 33 y.o. male who presents with chief complaint as above.  Patient presents the ED with a complaint of intractable nausea vomiting and chest discomfort.  He states that he was here a few days ago for the nausea and vomiting, and had an extensive work-up at that time.  He was sent home, but states that the nausea vomiting has intermittently been persistent since that time.  Today his chest discomfort got worse again.  Here in the ED tonight he had repeat imaging done with no significant abnormality.  Hospitalist were called for admission  PAST MEDICAL HISTORY:   Past Medical History:  Diagnosis Date  . Elevated blood pressure reading   . Fluttering heart    SAW DR Rockey Situ ON 07-04-17-EKG DONE AND WAS WNL-PT TO F/U IF NEEDED  . Hemorrhoids   . Pneumonia 2014     PAST SURGICAL HISTORY:   Past Surgical History:  Procedure Laterality Date  . HEMORRHOID SURGERY N/A 09/23/2017   Procedure: HEMORRHOIDECTOMY;  Surgeon: Christene Lye, MD;  Location: [facility] OR[facility];  Service: General;  Laterality: N/A;  . RECTAL EXAM UNDER ANESTHESIA  09/23/2017   Procedure: RECTAL EXAM UNDER ANESTHESIA;  Surgeon: Christene Lye, MD;  Location: [facility] OR[facility];  Service: General;;  . TIBIA IM NAIL INSERTION Right 08/17/2019   Procedure: INTRAMEDULLARY (IM) NAIL TIBIAL;  Surgeon: Leim Fabry, MD;  Location: [facility] OR[facility];  Service: Orthopedics;  Laterality: Right;  . WISDOM TOOTH EXTRACTION       SOCIAL HISTORY:   Social History   Tobacco Use  . Smoking status: Current Every Day Smoker    Packs/day: 0.25     Types: Cigarettes  . Smokeless tobacco: Never Used  . Tobacco comment: A pack will last him a week.   Substance Use Topics  . Alcohol use: Yes    Comment: 2-3 BEERS DAILY     FAMILY HISTORY:   Family History  Problem Relation Age of Onset  . Diabetes Maternal Grandmother   . Heart disease Maternal Grandfather      DRUG ALLERGIES:  No Known Allergies  MEDICATIONS AT HOME:   Prior to Admission medications   Medication Sig Start Date End Date Taking? Authorizing Provider  aspirin EC 325 MG EC tablet Take 1 tablet (325 mg total) by mouth daily. 08/18/19  Yes Reche Dixon, PA-C  ondansetron (ZOFRAN ODT) 4 MG disintegrating tablet Take 1 tablet (4 mg total) by mouth every 8 (eight) hours as needed. 09/09/19  Yes Alfred Levins, [place], MD  oxyCODONE (OXY IR/ROXICODONE) 5 MG immediate release tablet Take 1 tablet (5 mg total) by mouth every 4 (four) hours as needed for severe pain (pain score 7-10). 08/25/19 09/24/19 Yes Leone Haven, MD  traMADol (ULTRAM) 50 MG tablet Take 1 tablet (50 mg total) by mouth every 6 (six) hours as needed for moderate pain. 08/18/19  Yes Reche Dixon, PA-C    REVIEW OF SYSTEMS:  Review of Systems  Constitutional: Negative for chills, fever, malaise/fatigue and weight loss.  HENT: Negative for ear pain, hearing loss and tinnitus.  Eyes: Negative for blurred vision, double vision, pain and redness.  Respiratory: Negative for cough, hemoptysis and shortness of breath.   Cardiovascular: Positive for chest pain. Negative for palpitations, orthopnea and leg swelling.  Gastrointestinal: Positive for abdominal pain, nausea and vomiting. Negative for constipation and diarrhea.  Genitourinary: Negative for dysuria, frequency and hematuria.  Musculoskeletal: Negative for back pain, joint pain and neck pain.  Skin:       No acne, rash, or lesions  Neurological: Negative for dizziness, tremors, focal weakness and weakness.  Endo/Heme/Allergies: Negative for  polydipsia. Does not bruise/bleed easily.  Psychiatric/Behavioral: Negative for depression. The patient is not nervous/anxious and does not have insomnia.      VITAL SIGNS:   Vitals:   09/11/19 2023 09/11/19 2115 09/11/19 2145 09/11/19 2207  BP: (!) 173/101  (!) 137/97 (!) 145/88  Pulse: 79  80 70  Resp: 19 16 17 14   Temp: 98.2 F (36.8 C)     SpO2: 99%  97% 92%  Weight:      Height:       Wt Readings from Last 3 Encounters:  09/11/19 86.2 kg  09/09/19 86.2 kg  08/24/19 86.3 kg    PHYSICAL EXAMINATION:  Physical Exam  Vitals reviewed. Constitutional: He is oriented to person, place, and time. He appears well-developed and well-nourished. No distress.  HENT:  Head: Normocephalic and atraumatic.  Mouth/Throat: Oropharynx is clear and moist.  Eyes: Pupils are equal, round, and reactive to light. Conjunctivae and EOM are normal. No scleral icterus.  Neck: Normal range of motion. Neck supple. No JVD present. No thyromegaly present.  Cardiovascular: Normal rate, regular rhythm and intact distal pulses. Exam reveals no gallop and no friction rub.  No murmur heard. Respiratory: Effort normal and breath sounds normal. No respiratory distress. He has no wheezes. He has no rales.  GI: Soft. Bowel sounds are normal. He exhibits no distension. There is abdominal tenderness.  Musculoskeletal: Normal range of motion.        General: No edema.     Comments: No arthritis, no gout  Lymphadenopathy:    He has no cervical adenopathy.  Neurological: He is alert and oriented to person, place, and time. No cranial nerve deficit.  No dysarthria, no aphasia  Skin: Skin is warm and dry. No rash noted. No erythema.  Psychiatric: He has a normal mood and affect. His behavior is normal. Judgment and thought content normal.    LABORATORY PANEL:   CBC Recent Labs  Lab 09/11/19 1808  WBC 11.1*  HGB 14.9  HCT 41.1  PLT 440*    ------------------------------------------------------------------------------------------------------------------  Chemistries  Recent Labs  Lab 09/11/19 1808  NA 133*  K 3.0*  CL 95*  CO2 22  GLUCOSE 122*  BUN 12  CREATININE 1.07  CALCIUM 10.7*  AST 27  ALT 38  ALKPHOS 33*  BILITOT 1.6*   ------------------------------------------------------------------------------------------------------------------  Cardiac Enzymes No results for input(s): TROPONINI in the last 168 hours. ------------------------------------------------------------------------------------------------------------------  RADIOLOGY:  Dg Chest 2 View  Result Date: 09/11/2019 CLINICAL DATA:  Left-sided chest pain EXAM: CHEST - 2 VIEW COMPARISON:  09/09/2019 FINDINGS: The heart size and mediastinal contours are within normal limits. Both lungs are clear. The visualized skeletal structures are unremarkable. IMPRESSION: No acute abnormality of the lungs. Electronically Signed   By: Eddie Candle M.D.   On: 09/11/2019 18:34   Ct Angio Chest Pe W And/or Wo Contrast  Result Date: 09/11/2019 CLINICAL DATA:  Complex chest pain. EXAM: CT ANGIOGRAPHY CHEST  CT ABDOMEN AND PELVIS WITH CONTRAST TECHNIQUE: Multidetector CT imaging of the chest was performed using the standard protocol during bolus administration of intravenous contrast. Multiplanar CT image reconstructions and MIPs were obtained to evaluate the vascular anatomy. Multidetector CT imaging of the abdomen and pelvis was performed using the standard protocol during bolus administration of intravenous contrast. CONTRAST:  151mL OMNIPAQUE IOHEXOL 350 MG/ML SOLN COMPARISON:  CT dated 09/09/2019. FINDINGS: CTA CHEST FINDINGS Cardiovascular: Contrast injection is sufficient to demonstrate satisfactory opacification of the pulmonary arteries to the segmental level. There is no pulmonary embolus. The main pulmonary artery is within normal limits for size. There is no CT  evidence of acute right heart strain. The visualized aorta is normal. Heart size is normal, without pericardial effusion. Mediastinum/Nodes: --No mediastinal or hilar lymphadenopathy. --No axillary lymphadenopathy. --No supraclavicular lymphadenopathy. --Normal thyroid gland. --The esophagus is unremarkable Lungs/Pleura: No pulmonary nodules or masses. No pleural effusion or pneumothorax. No focal airspace consolidation. No focal pleural abnormality. 2 Musculoskeletal: No chest wall abnormality. No acute or significant osseous findings. Review of the MIP images confirms the above findings. CT ABDOMEN and PELVIS FINDINGS Hepatobiliary: The liver is normal. Normal gallbladder.There is no biliary ductal dilation. Pancreas: Normal contours without ductal dilatation. No peripancreatic fluid collection. Spleen: No splenic laceration or hematoma. Adrenals/Urinary Tract: --Adrenal glands: No adrenal hemorrhage. --Right kidney/ureter: No hydronephrosis or perinephric hematoma. --Left kidney/ureter: No hydronephrosis or perinephric hematoma. --Urinary bladder: Unremarkable. Stomach/Bowel: --Stomach/Duodenum: No hiatal hernia or other gastric abnormality. Normal duodenal course and caliber. --Small bowel: No dilatation or inflammation. --Colon: No focal abnormality. --Appendix: Normal. Vascular/Lymphatic: Normal course and caliber of the major abdominal vessels. --No retroperitoneal lymphadenopathy. --No mesenteric lymphadenopathy. --No pelvic or inguinal lymphadenopathy. Reproductive: Unremarkable Other: No ascites or free air. There is a fat containing left inguinal hernia. Musculoskeletal. No acute displaced fractures. Review of the MIP images confirms the above findings. IMPRESSION: 1. No acute pulmonary embolus. 2. No evidence for pneumomediastinum. 3. No acute abnormality detected involving the chest, abdomen, or pelvis. Electronically Signed   By: Constance Holster M.D.   On: 09/11/2019 22:31   Ct Abdomen Pelvis W  Contrast  Result Date: 09/11/2019 CLINICAL DATA:  Complex chest pain. EXAM: CT ANGIOGRAPHY CHEST CT ABDOMEN AND PELVIS WITH CONTRAST TECHNIQUE: Multidetector CT imaging of the chest was performed using the standard protocol during bolus administration of intravenous contrast. Multiplanar CT image reconstructions and MIPs were obtained to evaluate the vascular anatomy. Multidetector CT imaging of the abdomen and pelvis was performed using the standard protocol during bolus administration of intravenous contrast. CONTRAST:  123mL OMNIPAQUE IOHEXOL 350 MG/ML SOLN COMPARISON:  CT dated 09/09/2019. FINDINGS: CTA CHEST FINDINGS Cardiovascular: Contrast injection is sufficient to demonstrate satisfactory opacification of the pulmonary arteries to the segmental level. There is no pulmonary embolus. The main pulmonary artery is within normal limits for size. There is no CT evidence of acute right heart strain. The visualized aorta is normal. Heart size is normal, without pericardial effusion. Mediastinum/Nodes: --No mediastinal or hilar lymphadenopathy. --No axillary lymphadenopathy. --No supraclavicular lymphadenopathy. --Normal thyroid gland. --The esophagus is unremarkable Lungs/Pleura: No pulmonary nodules or masses. No pleural effusion or pneumothorax. No focal airspace consolidation. No focal pleural abnormality. 2 Musculoskeletal: No chest wall abnormality. No acute or significant osseous findings. Review of the MIP images confirms the above findings. CT ABDOMEN and PELVIS FINDINGS Hepatobiliary: The liver is normal. Normal gallbladder.There is no biliary ductal dilation. Pancreas: Normal contours without ductal dilatation. No peripancreatic fluid collection. Spleen: No splenic laceration  or hematoma. Adrenals/Urinary Tract: --Adrenal glands: No adrenal hemorrhage. --Right kidney/ureter: No hydronephrosis or perinephric hematoma. --Left kidney/ureter: No hydronephrosis or perinephric hematoma. --Urinary bladder:  Unremarkable. Stomach/Bowel: --Stomach/Duodenum: No hiatal hernia or other gastric abnormality. Normal duodenal course and caliber. --Small bowel: No dilatation or inflammation. --Colon: No focal abnormality. --Appendix: Normal. Vascular/Lymphatic: Normal course and caliber of the major abdominal vessels. --No retroperitoneal lymphadenopathy. --No mesenteric lymphadenopathy. --No pelvic or inguinal lymphadenopathy. Reproductive: Unremarkable Other: No ascites or free air. There is a fat containing left inguinal hernia. Musculoskeletal. No acute displaced fractures. Review of the MIP images confirms the above findings. IMPRESSION: 1. No acute pulmonary embolus. 2. No evidence for pneumomediastinum. 3. No acute abnormality detected involving the chest, abdomen, or pelvis. Electronically Signed   By: Constance Holster M.D.   On: 09/11/2019 22:31    EKG:   Orders placed or performed during the hospital encounter of 09/11/19  . ED EKG  . ED EKG    IMPRESSION AND PLAN:  Principal Problem:   Intractable nausea and vomiting -PRN antiemetics, IV fluids, electrolyte monitoring and replacement Active Problems:   Chest pain -unclear etiology.  No PE on imaging, no pneumonia, no other abnormality to explain his chest discomfort.  Possibly musculoskeletal from significant nausea vomiting.  Possibly something like Mallory-Weiss tear.  Monitor tonight with above treatment.  If he still having significant chest discomfort in the morning might consider x-ray imaging with some water contrast to evaluate for esophageal abnormality, though this is not very highly suspected   Elevated BP without diagnosis of hypertension -control symptoms as above, administer antihypertensives if necessary   GERD (gastroesophageal reflux disease) -PPI  Chart review performed and case discussed with ED provider. Labs, imaging and/or ECG reviewed by provider and discussed with patient/family. Management plans discussed with the patient  and/or family.  COVID-19 status: Pending  DVT PROPHYLAXIS: SubQ lovenox   GI PROPHYLAXIS:  PPI  ADMISSION STATUS: Observation  CODE STATUS: Full Code Status History    Date Active Date Inactive Code Status Order ID Comments User Context   08/17/2019 0001 08/18/2019 1939 Full Code [id number]  Leim Fabry, MD Inpatient   Advance Care Planning Activity      TOTAL TIME TAKING CARE OF THIS PATIENT: 40 minutes.   This patient was evaluated in the context of the global COVID-19 pandemic, which necessitated consideration that the patient might be at risk for infection with the SARS-CoV-2 virus that causes COVID-19. Institutional protocols and algorithms that pertain to the evaluation of patients at risk for COVID-19 are in a state of rapid change based on information released by regulatory bodies including the CDC and federal and state organizations. These policies and algorithms were followed to the best of this provider's knowledge to date during the patient's care at this facility.  Ethlyn Daniels 09/11/2019, 11:55 PM  [facility]  Office  [phone]  CC: Primary care physician; Leone Haven, MD  Note:  This document was prepared using Dragon voice recognition software and may include unintentional dictation errors.
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
## 2019-09-11 NOTE — Telephone Encounter (Signed)
Chest pain when exhaling, a little shortness of breath, stomach pains, unable to eat solids for 3 days, patient has not had a bowel movement in 3 days, no fever, chills and hot flashes, headache, body aches, a little cough, unable to sleep.    Patient was in the ED a couple of days ago.  Patient said that symptoms had gotten a little better this morning but now symptoms are back.    Patient doesn't want to go back to the ED or UC.  Patient request recommendations for symptoms to help them go away.  Please advise.

## 2019-09-11 NOTE — ED Notes (Signed)
Pt provided with a blanket and pillow per pt's request.

## 2019-09-11 NOTE — Telephone Encounter (Signed)
He really should be re-evaluated in person for his symptoms to determine if there is a specific cause. It is difficult to provide recommendations regarding those types of symptoms without an in person evaluation.

## 2019-09-12 ENCOUNTER — Observation Stay
Admit: 2019-09-12 | Discharge: 2019-09-12 | Disposition: A | Discharge status: Home / Self Care | Payer: Self-pay | Attending: Internal Medicine | Admitting: Internal Medicine

## 2019-09-12 LAB — BASIC METABOLIC PANEL
Anion gap: 8 (ref 5–15)
BUN: 11 mg/dL (ref 6–20)
CO2: 24 mmol/L (ref 22–32)
Calcium: 9.3 mg/dL (ref 8.9–10.3)
Chloride: 104 mmol/L (ref 98–111)
Creatinine, Ser: 0.84 mg/dL (ref 0.61–1.24)
GFR calc Af Amer: >60 mL/min (ref >60)
GFR calc non Af Amer: >60 mL/min (ref >60)
Glucose, Bld: 104 mg/dL — ABNORMAL HIGH (ref 70–99)
Potassium: 3.5 mmol/L (ref 3.5–5.1)
Sodium: 136 mmol/L (ref 135–145)

## 2019-09-12 LAB — ECHOCARDIOGRAM COMPLETE
Height: 73 in
Weight: 2827.2 oz

## 2019-09-12 LAB — CBC
HCT: 38.2 % — ABNORMAL LOW (ref 39.0–52.0)
Hemoglobin: 13.4 g/dL (ref 13.0–17.0)
MCH: 30.2 pg (ref 26.0–34.0)
MCHC: 35.1 g/dL (ref 30.0–36.0)
MCV: 86 fL (ref 80.0–100.0)
Platelets: 354 10*3/uL (ref 150–400)
RBC: 4.44 MIL/uL (ref 4.22–5.81)
RDW: 11.4 % — ABNORMAL LOW (ref 11.5–15.5)
WBC: 8.2 10*3/uL (ref 4.0–10.5)
nRBC: 0 % (ref 0.0–0.2)

## 2019-09-12 LAB — SARS CORONAVIRUS 2 (TAT 6-24 HRS): SARS Coronavirus 2: NEGATIVE

## 2019-09-12 MED ORDER — OXYCODONE HCL 5 MG PO TABS
5.0000 mg | ORAL_TABLET | ORAL | Status: DC | PRN
  Administered 2019-09-12: 5 mg via ORAL
  Filled 2019-09-12: qty 1

## 2019-09-12 MED ORDER — PANTOPRAZOLE SODIUM 40 MG IV SOLR
40.0000 mg | Freq: Once | INTRAVENOUS | Status: AC
  Administered 2019-09-12: 02:00:00 40 mg via INTRAVENOUS
  Filled 2019-09-12: qty 40

## 2019-09-12 MED ORDER — ONDANSETRON HCL 4 MG/2ML IJ SOLN: 4.0000 mg | Freq: Four times a day (QID) | INTRAMUSCULAR | Status: DC | PRN

## 2019-09-12 MED ORDER — ASPIRIN EC 325 MG PO TBEC
325.0000 mg | DELAYED_RELEASE_TABLET | Freq: Every day | ORAL | Status: DC
  Administered 2019-09-12: 325 mg via ORAL
  Filled 2019-09-12: qty 1

## 2019-09-12 MED ORDER — ONDANSETRON HCL 4 MG PO TABS: 4.0000 mg | ORAL_TABLET | Freq: Four times a day (QID) | ORAL | Status: DC | PRN

## 2019-09-12 MED ORDER — ENOXAPARIN SODIUM 40 MG/0.4ML ~~LOC~~ SOLN
40.0000 mg | Freq: Every day | SUBCUTANEOUS | Status: DC
  Administered 2019-09-12: 40 mg via SUBCUTANEOUS
  Filled 2019-09-12: qty 0.4

## 2019-09-12 MED ORDER — ONDANSETRON 4 MG PO TBDP: 4.0000 mg | ORAL_TABLET | Freq: Three times a day (TID) | ORAL | 0 refills | Status: DC | PRN

## 2019-09-12 MED ORDER — TRAMADOL HCL 50 MG PO TABS: 50.0000 mg | ORAL_TABLET | Freq: Four times a day (QID) | ORAL | Status: DC | PRN

## 2019-09-12 MED ORDER — ACETAMINOPHEN 650 MG RE SUPP: 650.0000 mg | Freq: Four times a day (QID) | RECTAL | Status: DC | PRN

## 2019-09-12 MED ORDER — SODIUM CHLORIDE 0.9 % IV SOLN
INTRAVENOUS | Status: AC
  Administered 2019-09-12: 03:00:00 via INTRAVENOUS

## 2019-09-12 MED ORDER — ACETAMINOPHEN 325 MG PO TABS: 650.0000 mg | ORAL_TABLET | Freq: Four times a day (QID) | ORAL | Status: DC | PRN

## 2019-09-12 MED ORDER — OMEPRAZOLE 20 MG PO CPDR: 20.0000 mg | DELAYED_RELEASE_CAPSULE | Freq: Every day | ORAL | 1 refills | Status: AC

## 2019-09-12 NOTE — Discharge Summary (Signed)
[facility] NAME: Cory Ward    MR#:  [id number]  DATE OF BIRTH:  08/26/1986  DATE OF ADMISSION:  09/11/2019 ADMITTING PHYSICIAN: Lance Coon, MD  DATE OF DISCHARGE:  09/12/19   PRIMARY CARE PHYSICIAN: Leone Haven, MD    ADMISSION DIAGNOSIS:  Chest pain, unspecified type [R07.9] Non-intractable vomiting with nausea, unspecified vomiting type [R11.2]  DISCHARGE DIAGNOSIS:  Principal Problem:   Intractable nausea and vomiting Active Problems:   GERD (gastroesophageal reflux disease)   Elevated BP without diagnosis of hypertension   Chest pain   SECONDARY DIAGNOSIS:   Past Medical History:  Diagnosis Date  . Elevated blood pressure reading   . Fluttering heart    SAW DR Rockey Situ ON 07-04-17-EKG DONE AND WAS WNL-PT TO F/U IF NEEDED  . Hemorrhoids   . Pneumonia 2014    HOSPITAL COURSE:   HPI - [facility] Suko  is a 33 y.o. male who presents with chief complaint as above.  Patient presents the ED with a complaint of intractable nausea vomiting and chest discomfort.  He states that he was here a few days ago for the nausea and vomiting, and had an extensive work-up at that time.  He was sent home, but states that the nausea vomiting has intermittently been persistent since that time.  Today his chest discomfort got worse again.  Here in the ED tonight he had repeat imaging done with no significant abnormality.  Hospitalist were called for admission  Intractable nausea and vomiting - Supportive treatment with PRN antiemetics, IV fluids, electrolyte monitoring and replacement provided Patient is feeling much better and tolerating diet without any discomfort.  Wants to go home.  We will discharge him home     Chest pain -atypical, -from GERD Completely resolved No PE on imaging, no pneumonia, no other abnormality to explain his chest discomfort.     Elevated BP without diagnosis of hypertension -back to baseline     GERD (gastroesophageal reflux disease) -PPI  Chart review performedand case discussed with ED provider. Labs, imaging and/or ECG reviewed by provider and discussed with patient/family. Management plans discussed with the patient and/or family.  COVID-19 status:        Pending.  PCP to follow the pending result  DVT PROPHYLAXIS: SubQ lovenox   GI PROPHYLAXIS:     PPI  DISCHARGE CONDITIONS:   fair  CONSULTS OBTAINED:     PROCEDURES  None   DRUG ALLERGIES:  No Known Allergies  DISCHARGE MEDICATIONS:   Allergies as of 09/12/2019   No Known Allergies     Medication List    TAKE these medications   aspirin 325 MG EC tablet Take 1 tablet (325 mg total) by mouth daily.   omeprazole 20 MG capsule Commonly known as: PriLOSEC Take 1 capsule (20 mg total) by mouth daily.   ondansetron 4 MG disintegrating tablet Commonly known as: Zofran ODT Take 1 tablet (4 mg total) by mouth every 8 (eight) hours as needed.   oxyCODONE 5 MG immediate release tablet Commonly known as: Oxy IR/ROXICODONE Take 1 tablet (5 mg total) by mouth every 4 (four) hours as needed for severe pain (pain score 7-10).   traMADol 50 MG tablet Commonly known as: ULTRAM Take 1 tablet (50 mg total) by mouth every 6 (six) hours as needed for moderate pain.        DISCHARGE INSTRUCTIONS:    Follow-up with primary care physician in 3 days he  is able to follow-up on the pending COVID results DIET:  Regular diet  DISCHARGE CONDITION:  Fair  ACTIVITY:  Activity as tolerated  OXYGEN:  Home Oxygen: No.   Oxygen Delivery: room air  DISCHARGE LOCATION:  home   If you experience worsening of your admission symptoms, develop shortness of breath, life threatening emergency, suicidal or homicidal thoughts you must seek medical attention immediately by calling 911 or calling your MD immediately  if symptoms less severe.  You Must read complete instructions/literature along with all the possible  adverse reactions/side effects for all the Medicines you take and that have been prescribed to you. Take any new Medicines after you have completely understood and accpet all the possible adverse reactions/side effects.   Please note  You were cared for by a hospitalist during your hospital stay. If you have any questions about your discharge medications or the care you received while you were in the hospital after you are discharged, you can call the unit and asked to speak with the hospitalist on call if the hospitalist that took care of you is not available. Once you are discharged, your primary care physician will handle any further medical issues. Please note that NO REFILLS for any discharge medications will be authorized once you are discharged, as it is imperative that you return to your primary care physician (or establish a relationship with a primary care physician if you do not have one) for your aftercare needs so that they can reassess your need for medications and monitor your lab values.     Today  Chief Complaint  Patient presents with  . Chest Pain  . Nausea   Patient is doing fine.  Denies any nausea vomiting or abdominal pain.  Denies any chest pain or shortness of breath.  Tolerated liquid diet and advanced diet and swallow without any discomfort.  Wants to go home.  Will discharge patient home.  COVID  test results are pending PCP to follow-up  ROS:  CONSTITUTIONAL: Denies fevers, chills. Denies any fatigue, weakness.  EYES: Denies blurry vision, double vision, eye pain. EARS, NOSE, THROAT: Denies tinnitus, ear pain, hearing loss. RESPIRATORY: Denies cough, wheeze, shortness of breath.  CARDIOVASCULAR: Denies chest pain, palpitations, edema.  GASTROINTESTINAL: Denies nausea, vomiting, diarrhea, abdominal pain. Denies bright red blood per rectum. GENITOURINARY: Denies dysuria, hematuria. ENDOCRINE: Denies nocturia or thyroid problems. HEMATOLOGIC AND LYMPHATIC: Denies  easy bruising or bleeding. SKIN: Denies rash or lesion. MUSCULOSKELETAL: Denies pain in neck, back, shoulder, knees, hips or arthritic symptoms.  NEUROLOGIC: Denies paralysis, paresthesias.  PSYCHIATRIC: Denies anxiety or depressive symptoms.   VITAL SIGNS:  Blood pressure 125/82, pulse 65, temperature 98.2 F (36.8 C), temperature source Oral, resp. rate 18, height 6\' 1"  (1.854 m), weight 80.2 kg, SpO2 98 %.  I/O:    Intake/Output Summary (Last 24 hours) at 09/12/2019 1301 Last data filed at 09/12/2019 1036 Gross per 24 hour  Intake 201.36 ml  Output 200 ml  Net 1.36 ml    PHYSICAL EXAMINATION:  GENERAL:  33 y.o.-year-old patient lying in the bed with no acute distress.  EYES: Pupils equal, round, reactive to light and accommodation. No scleral icterus. Extraocular muscles intact.  HEENT: Head atraumatic, normocephalic. Oropharynx and nasopharynx clear.  NECK:  Supple, no jugular venous distention. No thyroid enlargement, no tenderness.  LUNGS: Normal breath sounds bilaterally, no wheezing, rales,rhonchi or crepitation. No use of accessory muscles of respiration.  CARDIOVASCULAR: S1, S2 normal. No murmurs, rubs, or gallops.  ABDOMEN: Soft,  non-tender, non-distended. Bowel sounds present. No organomegaly or mass.  EXTREMITIES: No pedal edema, cyanosis, or clubbing.  NEUROLOGIC: Cranial nerves II through XII are intact. Muscle strength 5/5 in all extremities. Sensation intact. Gait not checked.  PSYCHIATRIC: The patient is alert and oriented x 3.  SKIN: No obvious rash, lesion, or ulcer.   DATA REVIEW:   CBC Recent Labs  Lab 09/12/19 0459  WBC 8.2  HGB 13.4  HCT 38.2*  PLT 354    Chemistries  Recent Labs  Lab 09/11/19 1808 09/12/19 0459  NA 133* 136  K 3.0* 3.5  CL 95* 104  CO2 22 24  GLUCOSE 122* 104*  BUN 12 11  CREATININE 1.07 0.84  CALCIUM 10.7* 9.3  AST 27  --   ALT 38  --   ALKPHOS 33*  --   BILITOT 1.6*  --     Cardiac Enzymes No results for  input(s): TROPONINI in the last 168 hours.  Microbiology Results  Results for orders placed or performed during the hospital encounter of 09/11/19  SARS CORONAVIRUS 2 (TAT 6-24 HRS) Nasopharyngeal Nasopharyngeal Swab     Status: None   Collection Time: 09/12/19 12:36 AM   Specimen: Nasopharyngeal Swab  Result Value Ref Range Status   SARS Coronavirus 2 NEGATIVE NEGATIVE Final    Comment: (NOTE) SARS-CoV-2 target nucleic acids are NOT DETECTED. The SARS-CoV-2 RNA is generally detectable in upper and lower respiratory specimens during the acute phase of infection. Negative results do not preclude SARS-CoV-2 infection, do not rule out co-infections with other pathogens, and should not be used as the sole basis for treatment or other patient management decisions. Negative results must be combined with clinical observations, patient history, and epidemiological information. The expected result is Negative. Fact Sheet for Patients: SugarRoll.be Fact Sheet for Healthcare Providers: https://www.woods-mathews.com/ This test is not yet approved or cleared by the [place] FDA and  has been authorized for detection and/or diagnosis of SARS-CoV-2 by FDA under an Emergency Use Authorization (EUA). This EUA will remain  in effect (meaning this test can be used) for the duration of the COVID-19 declaration under Section 56 4(b)(1) of the Act, 21 U.S.C. section 360bbb-3(b)(1), unless the authorization is terminated or revoked sooner. Performed at [facility], [place] [address], [place], [place] [address]     RADIOLOGY:  Dg Chest 2 View  Result Date: 09/11/2019 CLINICAL DATA:  Left-sided chest pain EXAM: CHEST - 2 VIEW COMPARISON:  09/09/2019 FINDINGS: The heart size and mediastinal contours are within normal limits. Both lungs are clear. The visualized skeletal structures are unremarkable. IMPRESSION: No acute abnormality of the lungs.  Electronically Signed   By: Eddie Candle M.D.   On: 09/11/2019 18:34   Dg Abdomen 1 View  Result Date: 09/09/2019 CLINICAL DATA:  Gastric attention on CTA chest EXAM: ABDOMEN - 1 VIEW COMPARISON:  Same day CTA chest FINDINGS: No significant gastric distention. Bowel gas pattern is nonobstructive. No radio-opaque calculi though evaluation is limited by the presence of excreted contrast media within the collecting system. Few scattered pelvic phleboliths. Small os acetabuli at the left hip. Osseous structures are otherwise unremarkable. IMPRESSION: Nonobstructive bowel gas pattern. Excreted contrast media in the urinary collecting system. Electronically Signed   By: Lovena Le M.D.   On: 09/09/2019 02:18   Ct Angio Chest Pe W And/or Wo Contrast  Result Date: 09/11/2019 CLINICAL DATA:  Complex chest pain. EXAM: CT ANGIOGRAPHY CHEST CT ABDOMEN AND PELVIS WITH CONTRAST TECHNIQUE: Multidetector  CT imaging of the chest was performed using the standard protocol during bolus administration of intravenous contrast. Multiplanar CT image reconstructions and MIPs were obtained to evaluate the vascular anatomy. Multidetector CT imaging of the abdomen and pelvis was performed using the standard protocol during bolus administration of intravenous contrast. CONTRAST:  139mL OMNIPAQUE IOHEXOL 350 MG/ML SOLN COMPARISON:  CT dated 09/09/2019. FINDINGS: CTA CHEST FINDINGS Cardiovascular: Contrast injection is sufficient to demonstrate satisfactory opacification of the pulmonary arteries to the segmental level. There is no pulmonary embolus. The main pulmonary artery is within normal limits for size. There is no CT evidence of acute right heart strain. The visualized aorta is normal. Heart size is normal, without pericardial effusion. Mediastinum/Nodes: --No mediastinal or hilar lymphadenopathy. --No axillary lymphadenopathy. --No supraclavicular lymphadenopathy. --Normal thyroid gland. --The esophagus is unremarkable  Lungs/Pleura: No pulmonary nodules or masses. No pleural effusion or pneumothorax. No focal airspace consolidation. No focal pleural abnormality. 2 Musculoskeletal: No chest wall abnormality. No acute or significant osseous findings. Review of the MIP images confirms the above findings. CT ABDOMEN and PELVIS FINDINGS Hepatobiliary: The liver is normal. Normal gallbladder.There is no biliary ductal dilation. Pancreas: Normal contours without ductal dilatation. No peripancreatic fluid collection. Spleen: No splenic laceration or hematoma. Adrenals/Urinary Tract: --Adrenal glands: No adrenal hemorrhage. --Right kidney/ureter: No hydronephrosis or perinephric hematoma. --Left kidney/ureter: No hydronephrosis or perinephric hematoma. --Urinary bladder: Unremarkable. Stomach/Bowel: --Stomach/Duodenum: No hiatal hernia or other gastric abnormality. Normal duodenal course and caliber. --Small bowel: No dilatation or inflammation. --Colon: No focal abnormality. --Appendix: Normal. Vascular/Lymphatic: Normal course and caliber of the major abdominal vessels. --No retroperitoneal lymphadenopathy. --No mesenteric lymphadenopathy. --No pelvic or inguinal lymphadenopathy. Reproductive: Unremarkable Other: No ascites or free air. There is a fat containing left inguinal hernia. Musculoskeletal. No acute displaced fractures. Review of the MIP images confirms the above findings. IMPRESSION: 1. No acute pulmonary embolus. 2. No evidence for pneumomediastinum. 3. No acute abnormality detected involving the chest, abdomen, or pelvis. Electronically Signed   By: Constance Holster M.D.   On: 09/11/2019 22:31   Ct Angio Chest Pe W And/or Wo Contrast  Result Date: 09/09/2019 CLINICAL DATA:  Chest pain and shortness of breath, 3 weeks postop right leg surgery. No history of anticoagulation. EXAM: CT ANGIOGRAPHY CHEST WITH CONTRAST TECHNIQUE: Multidetector CT imaging of the chest was performed using the standard protocol during bolus  administration of intravenous contrast. Multiplanar CT image reconstructions and MIPs were obtained to evaluate the vascular anatomy. CONTRAST:  140mL OMNIPAQUE IOHEXOL 350 MG/ML SOLN COMPARISON:  Same day chest radiograph FINDINGS: Cardiovascular: Satisfactory opacification of the pulmonary arteries to the segmental level. No central, lobar or segmental filling defects. Central pulmonary artery is normal caliber. No elevation of the RV/LV ratio (0.85). Normal heart size. No pericardial effusion. The aorta is normal caliber. Proximal great vessels are unremarkable. Mediastinum/Nodes: No enlarged mediastinal or axillary lymph nodes. Thyroid gland, trachea, and esophagus demonstrate no significant findings. Lungs/Pleura: No consolidation, features of edema, pneumothorax, or effusion. Insert no nodes Upper Abdomen: No acute abnormalities present in the visualized portions of the upper abdomen. Musculoskeletal: Mild pectus deformity of the chest (Haller index of 2.7). No acute or suspicious osseous lesion. Mild left gynecomastia. Review of the MIP images confirms the above findings. IMPRESSION: 1. No evidence of pulmonary embolism. No acute intrathoracic process. 2. Mild pectus excavatum. 3. Mild left gynecomastia. Electronically Signed   By: Lovena Le M.D.   On: 09/09/2019 02:14   Ct Abdomen Pelvis W Contrast  Result Date:  09/11/2019 CLINICAL DATA:  Complex chest pain. EXAM: CT ANGIOGRAPHY CHEST CT ABDOMEN AND PELVIS WITH CONTRAST TECHNIQUE: Multidetector CT imaging of the chest was performed using the standard protocol during bolus administration of intravenous contrast. Multiplanar CT image reconstructions and MIPs were obtained to evaluate the vascular anatomy. Multidetector CT imaging of the abdomen and pelvis was performed using the standard protocol during bolus administration of intravenous contrast. CONTRAST:  166mL OMNIPAQUE IOHEXOL 350 MG/ML SOLN COMPARISON:  CT dated 09/09/2019. FINDINGS: CTA CHEST  FINDINGS Cardiovascular: Contrast injection is sufficient to demonstrate satisfactory opacification of the pulmonary arteries to the segmental level. There is no pulmonary embolus. The main pulmonary artery is within normal limits for size. There is no CT evidence of acute right heart strain. The visualized aorta is normal. Heart size is normal, without pericardial effusion. Mediastinum/Nodes: --No mediastinal or hilar lymphadenopathy. --No axillary lymphadenopathy. --No supraclavicular lymphadenopathy. --Normal thyroid gland. --The esophagus is unremarkable Lungs/Pleura: No pulmonary nodules or masses. No pleural effusion or pneumothorax. No focal airspace consolidation. No focal pleural abnormality. 2 Musculoskeletal: No chest wall abnormality. No acute or significant osseous findings. Review of the MIP images confirms the above findings. CT ABDOMEN and PELVIS FINDINGS Hepatobiliary: The liver is normal. Normal gallbladder.There is no biliary ductal dilation. Pancreas: Normal contours without ductal dilatation. No peripancreatic fluid collection. Spleen: No splenic laceration or hematoma. Adrenals/Urinary Tract: --Adrenal glands: No adrenal hemorrhage. --Right kidney/ureter: No hydronephrosis or perinephric hematoma. --Left kidney/ureter: No hydronephrosis or perinephric hematoma. --Urinary bladder: Unremarkable. Stomach/Bowel: --Stomach/Duodenum: No hiatal hernia or other gastric abnormality. Normal duodenal course and caliber. --Small bowel: No dilatation or inflammation. --Colon: No focal abnormality. --Appendix: Normal. Vascular/Lymphatic: Normal course and caliber of the major abdominal vessels. --No retroperitoneal lymphadenopathy. --No mesenteric lymphadenopathy. --No pelvic or inguinal lymphadenopathy. Reproductive: Unremarkable Other: No ascites or free air. There is a fat containing left inguinal hernia. Musculoskeletal. No acute displaced fractures. Review of the MIP images confirms the above findings.  IMPRESSION: 1. No acute pulmonary embolus. 2. No evidence for pneumomediastinum. 3. No acute abnormality detected involving the chest, abdomen, or pelvis. Electronically Signed   By: Constance Holster M.D.   On: 09/11/2019 22:31   Dg Chest Portable 1 View  Result Date: 09/09/2019 CLINICAL DATA:  33 year old male with chest pain and shortness of breath onset tonight. EXAM: PORTABLE CHEST 1 VIEW COMPARISON:  Portable chest 08/16/2019. FINDINGS: Portable AP upright view at 0022 hours. Lung volumes remain within normal limits. Mediastinal contours are stable and within normal limits. Visualized tracheal air column is within normal limits. Allowing for portable technique the lungs are clear. No pneumothorax. Negative visible bowel gas pattern. No acute osseous abnormality identified. IMPRESSION: Negative portable chest. Electronically Signed   By: Genevie Ann M.D.   On: 09/09/2019 00:46    EKG:   Orders placed or performed during the hospital encounter of 09/11/19  . ED EKG  . ED EKG      Management plans discussed with the patient, family and they are in agreement.  CODE STATUS:     Code Status Orders  (From admission, onward)         Start     Ordered   09/12/19 0220  Full code  Continuous     09/12/19 0219        Code Status History    Date Active Date Inactive Code Status Order ID Comments User Context   08/17/2019 0001 08/18/2019 1939 Full Code [id number]  Leim Fabry, MD Inpatient   Advance Care  Planning Activity      TOTAL TIME TAKING CARE OF THIS PATIENT: 41  minutes.   Note: This dictation was prepared with Dragon dictation along with smaller phrase technology. Any transcriptional errors that result from this process are unintentional.   @[facility] @  on 09/12/2019 at 1:01 PM  Between 7am to 6pm - Pager - [phone]  After 6pm go to [email or web address] - password [email or web address] [facility]  [facility]  Office  [phone]  CC: Primary care physician; Leone Haven,  MD

## 2019-09-12 NOTE — Progress Notes (Signed)
Patient discharged home with mother this afternoon after tolerating diet. Discharge information, follow up appointment information and  Medication education provided.

## 2019-09-12 NOTE — Discharge Instructions (Signed)
Follow-up with primary care physician in 3 days he is able to follow-up on the pending COVID results

## 2019-09-12 NOTE — Plan of Care (Signed)
  Problem: Education: Goal: Knowledge of General Education information will improve Description: Including pain rating scale, medication(s)/side effects and non-pharmacologic comfort measures Outcome: Progressing   Problem: Clinical Measurements: Goal: Will remain free from infection Outcome: Progressing   Problem: Pain Managment: Goal: General experience of comfort will improve Outcome: Progressing   

## 2019-09-12 NOTE — ED Notes (Signed)
Transport to floor room 240.AS

## 2019-09-12 NOTE — ED Notes (Signed)
ED TO INPATIENT HANDOFF REPORT  ED Nurse Name and Phone #: Wells Guiles [phone]  S Name/Age/Gender [facility] Cory Ward 33 y.o. male Room/Bed: ED13A/ED13A  Code Status   Code Status: Prior  Home/SNF/Other Home Patient oriented to: self, place, time and situation Is this baseline? Yes   Triage Complete: Triage complete  Chief Complaint Gurd  Triage Note No notes on file   Allergies No Known Allergies  Level of Care/Admitting Diagnosis ED Disposition    ED Disposition Condition [place]: [place] [100120]  Level of Care: Telemetry [5]  Covid Evaluation: Asymptomatic Screening Protocol (No Symptoms)  Diagnosis: Intractable nausea and vomiting [id number]  Admitting Physician: Lance Coon [id number]  Attending Physician: Lance Coon [id number]  Bed request comments: 2a  PT Class (Do Not Modify): Observation [104]  PT Acc Code (Do Not Modify): Observation [10022]       B Medical/Surgery History Past Medical History:  Diagnosis Date  . Elevated blood pressure reading   . Fluttering heart    SAW DR Rockey Situ ON 07-04-17-EKG DONE AND WAS WNL-PT TO F/U IF NEEDED  . Hemorrhoids   . Pneumonia 2014   Past Surgical History:  Procedure Laterality Date  . HEMORRHOID SURGERY N/A 09/23/2017   Procedure: HEMORRHOIDECTOMY;  Surgeon: Christene Lye, MD;  Location: [facility] ORS;  Service: General;  Laterality: N/A;  . RECTAL EXAM UNDER ANESTHESIA  09/23/2017   Procedure: RECTAL EXAM UNDER ANESTHESIA;  Surgeon: Christene Lye, MD;  Location: [facility] ORS;  Service: General;;  . TIBIA IM NAIL INSERTION Right 08/17/2019   Procedure: INTRAMEDULLARY (IM) NAIL TIBIAL;  Surgeon: Leim Fabry, MD;  Location: [facility] ORS;  Service: Orthopedics;  Laterality: Right;  . WISDOM TOOTH EXTRACTION       A IV Location/Drains/Wounds Patient Lines/Drains/Airways Status   Active Line/Drains/Airways    Name:   Placement date:   Placement time:   Site:    Days:   Peripheral IV 09/11/19 Right Antecubital   09/11/19    2143    Antecubital   1   Incision (Closed) 09/23/17 Rectum   09/23/17    1451     719   Incision (Closed) 08/17/19 Leg Right   08/17/19    1508     26          Intake/Output Last 24 hours No intake or output data in the 24 hours ending 09/12/19 0119  Labs/Imaging Results for orders placed or performed during the hospital encounter of 09/11/19 (from the past 48 hour(s))  Basic metabolic panel     Status: Abnormal   Collection Time: 09/11/19  6:08 PM  Result Value Ref Range   Sodium 133 (L) 135 - 145 mmol/L   Potassium 3.0 (L) 3.5 - 5.1 mmol/L   Chloride 95 (L) 98 - 111 mmol/L   CO2 22 22 - 32 mmol/L   Glucose, Bld 122 (H) 70 - 99 mg/dL   BUN 12 6 - 20 mg/dL   Creatinine, Ser 1.07 0.61 - 1.24 mg/dL   Calcium 10.7 (H) 8.9 - 10.3 mg/dL   GFR calc non Af Amer >60 >60 mL/min   GFR calc Af Amer >60 >60 mL/min   Anion gap 16 (H) 5 - 15    Comment: Performed at [facility], [address] [place], [place] [address]  CBC     Status: Abnormal   Collection Time: 09/11/19  6:08 PM  Result Value Ref Range   WBC 11.1 (H)  4.0 - 10.5 K/uL   RBC 4.94 4.22 - 5.81 MIL/uL   Hemoglobin 14.9 13.0 - 17.0 g/dL   HCT 41.1 39.0 - 52.0 %   MCV 83.2 80.0 - 100.0 fL   MCH 30.2 26.0 - 34.0 pg   MCHC 36.3 (H) 30.0 - 36.0 g/dL   RDW 11.3 (L) 11.5 - 15.5 %   Platelets 440 (H) 150 - 400 K/uL   nRBC 0.0 0.0 - 0.2 %    Comment: Performed at [facility], [place], [place] [address]  Troponin I (High Sensitivity)     Status: None   Collection Time: 09/11/19  6:08 PM  Result Value Ref Range   Troponin I (High Sensitivity) 12 <18 ng/L    Comment: (NOTE) Elevated high sensitivity troponin I (hsTnI) values and significant  changes across serial measurements may suggest ACS but many other  chronic and acute conditions are known to elevate hsTnI results.  Refer to the "Links" section for chest pain algorithms  and additional  guidance. Performed at [facility], [address] [place], [place] [address]   Hepatic function panel     Status: Abnormal   Collection Time: 09/11/19  6:08 PM  Result Value Ref Range   Total Protein 8.2 (H) 6.5 - 8.1 g/dL   Albumin 5.1 (H) 3.5 - 5.0 g/dL   AST 27 15 - 41 U/L   ALT 38 0 - 44 U/L   Alkaline Phosphatase 33 (L) 38 - 126 U/L   Total Bilirubin 1.6 (H) 0.3 - 1.2 mg/dL   Bilirubin, Direct 0.2 0.0 - 0.2 mg/dL   Indirect Bilirubin 1.4 (H) 0.3 - 0.9 mg/dL    Comment: Performed at [facility], [address] [place], [place] [address]  Lipase, blood     Status: None   Collection Time: 09/11/19  6:08 PM  Result Value Ref Range   Lipase 35 11 - 51 U/L    Comment: Performed at [facility], [place], [place] [address]  Troponin I (High Sensitivity)     Status: None   Collection Time: 09/11/19  9:42 PM  Result Value Ref Range   Troponin I (High Sensitivity) 14 <18 ng/L    Comment: (NOTE) Elevated high sensitivity troponin I (hsTnI) values and significant  changes across serial measurements may suggest ACS but many other  chronic and acute conditions are known to elevate hsTnI results.  Refer to the "Links" section for chest pain algorithms and additional  guidance. Performed at [facility], [address] [place], [place] [address]    Dg Chest 2 View  Result Date: 09/11/2019 CLINICAL DATA:  Left-sided chest pain EXAM: CHEST - 2 VIEW COMPARISON:  09/09/2019 FINDINGS: The heart size and mediastinal contours are within normal limits. Both lungs are clear. The visualized skeletal structures are unremarkable. IMPRESSION: No acute abnormality of the lungs. Electronically Signed   By: Eddie Candle M.D.   On: 09/11/2019 18:34   Ct Angio Chest Pe W And/or Wo Contrast  Result Date: 09/11/2019 CLINICAL DATA:  Complex chest pain. EXAM: CT ANGIOGRAPHY CHEST CT ABDOMEN AND PELVIS WITH CONTRAST TECHNIQUE:  Multidetector CT imaging of the chest was performed using the standard protocol during bolus administration of intravenous contrast. Multiplanar CT image reconstructions and MIPs were obtained to evaluate the vascular anatomy. Multidetector CT imaging of the abdomen and pelvis was performed using the standard protocol during bolus administration of intravenous contrast. CONTRAST:  167mL OMNIPAQUE  IOHEXOL 350 MG/ML SOLN COMPARISON:  CT dated 09/09/2019. FINDINGS: CTA CHEST FINDINGS Cardiovascular: Contrast injection is sufficient to demonstrate satisfactory opacification of the pulmonary arteries to the segmental level. There is no pulmonary embolus. The main pulmonary artery is within normal limits for size. There is no CT evidence of acute right heart strain. The visualized aorta is normal. Heart size is normal, without pericardial effusion. Mediastinum/Nodes: --No mediastinal or hilar lymphadenopathy. --No axillary lymphadenopathy. --No supraclavicular lymphadenopathy. --Normal thyroid gland. --The esophagus is unremarkable Lungs/Pleura: No pulmonary nodules or masses. No pleural effusion or pneumothorax. No focal airspace consolidation. No focal pleural abnormality. 2 Musculoskeletal: No chest wall abnormality. No acute or significant osseous findings. Review of the MIP images confirms the above findings. CT ABDOMEN and PELVIS FINDINGS Hepatobiliary: The liver is normal. Normal gallbladder.There is no biliary ductal dilation. Pancreas: Normal contours without ductal dilatation. No peripancreatic fluid collection. Spleen: No splenic laceration or hematoma. Adrenals/Urinary Tract: --Adrenal glands: No adrenal hemorrhage. --Right kidney/ureter: No hydronephrosis or perinephric hematoma. --Left kidney/ureter: No hydronephrosis or perinephric hematoma. --Urinary bladder: Unremarkable. Stomach/Bowel: --Stomach/Duodenum: No hiatal hernia or other gastric abnormality. Normal duodenal course and caliber. --Small bowel: No  dilatation or inflammation. --Colon: No focal abnormality. --Appendix: Normal. Vascular/Lymphatic: Normal course and caliber of the major abdominal vessels. --No retroperitoneal lymphadenopathy. --No mesenteric lymphadenopathy. --No pelvic or inguinal lymphadenopathy. Reproductive: Unremarkable Other: No ascites or free air. There is a fat containing left inguinal hernia. Musculoskeletal. No acute displaced fractures. Review of the MIP images confirms the above findings. IMPRESSION: 1. No acute pulmonary embolus. 2. No evidence for pneumomediastinum. 3. No acute abnormality detected involving the chest, abdomen, or pelvis. Electronically Signed   By: Constance Holster M.D.   On: 09/11/2019 22:31   Ct Abdomen Pelvis W Contrast  Result Date: 09/11/2019 CLINICAL DATA:  Complex chest pain. EXAM: CT ANGIOGRAPHY CHEST CT ABDOMEN AND PELVIS WITH CONTRAST TECHNIQUE: Multidetector CT imaging of the chest was performed using the standard protocol during bolus administration of intravenous contrast. Multiplanar CT image reconstructions and MIPs were obtained to evaluate the vascular anatomy. Multidetector CT imaging of the abdomen and pelvis was performed using the standard protocol during bolus administration of intravenous contrast. CONTRAST:  157mL OMNIPAQUE IOHEXOL 350 MG/ML SOLN COMPARISON:  CT dated 09/09/2019. FINDINGS: CTA CHEST FINDINGS Cardiovascular: Contrast injection is sufficient to demonstrate satisfactory opacification of the pulmonary arteries to the segmental level. There is no pulmonary embolus. The main pulmonary artery is within normal limits for size. There is no CT evidence of acute right heart strain. The visualized aorta is normal. Heart size is normal, without pericardial effusion. Mediastinum/Nodes: --No mediastinal or hilar lymphadenopathy. --No axillary lymphadenopathy. --No supraclavicular lymphadenopathy. --Normal thyroid gland. --The esophagus is unremarkable Lungs/Pleura: No pulmonary  nodules or masses. No pleural effusion or pneumothorax. No focal airspace consolidation. No focal pleural abnormality. 2 Musculoskeletal: No chest wall abnormality. No acute or significant osseous findings. Review of the MIP images confirms the above findings. CT ABDOMEN and PELVIS FINDINGS Hepatobiliary: The liver is normal. Normal gallbladder.There is no biliary ductal dilation. Pancreas: Normal contours without ductal dilatation. No peripancreatic fluid collection. Spleen: No splenic laceration or hematoma. Adrenals/Urinary Tract: --Adrenal glands: No adrenal hemorrhage. --Right kidney/ureter: No hydronephrosis or perinephric hematoma. --Left kidney/ureter: No hydronephrosis or perinephric hematoma. --Urinary bladder: Unremarkable. Stomach/Bowel: --Stomach/Duodenum: No hiatal hernia or other gastric abnormality. Normal duodenal course and caliber. --Small bowel: No dilatation or inflammation. --Colon: No focal abnormality. --Appendix: Normal. Vascular/Lymphatic: Normal course and caliber of the major abdominal vessels. --  No retroperitoneal lymphadenopathy. --No mesenteric lymphadenopathy. --No pelvic or inguinal lymphadenopathy. Reproductive: Unremarkable Other: No ascites or free air. There is a fat containing left inguinal hernia. Musculoskeletal. No acute displaced fractures. Review of the MIP images confirms the above findings. IMPRESSION: 1. No acute pulmonary embolus. 2. No evidence for pneumomediastinum. 3. No acute abnormality detected involving the chest, abdomen, or pelvis. Electronically Signed   By: Constance Holster M.D.   On: 09/11/2019 22:31    Pending Labs Unresulted Labs (From admission, onward)    Start     Ordered   09/11/19 2331  SARS CORONAVIRUS 2 (TAT 6-24 HRS) Nasopharyngeal Nasopharyngeal Swab  (Asymptomatic/Tier 2 Patients Labs)  Once,   STAT    Question Answer Comment  Is this test for diagnosis or screening Screening   Symptomatic for COVID-19 as defined by CDC No    Hospitalized for COVID-19 No   Admitted to ICU for COVID-19 No   Previously tested for COVID-19 Yes   Resident in a congregate (group) care setting No   Employed in healthcare setting No      09/11/19 2330   Signed and Held  CBC  (enoxaparin (LOVENOX)    CrCl >/= 30 ml/min)  Once,   R    Comments: Baseline for enoxaparin therapy IF NOT ALREADY DRAWN.  Notify MD if PLT < 100 K.    Signed and Held   Signed and Held  Creatinine, serum  (enoxaparin (LOVENOX)    CrCl >/= 30 ml/min)  Once,   R    Comments: Baseline for enoxaparin therapy IF NOT ALREADY DRAWN.    Signed and Held   Signed and Held  Creatinine, serum  (enoxaparin (LOVENOX)    CrCl >/= 30 ml/min)  Weekly,   R    Comments: while on enoxaparin therapy    Signed and Held   Signed and Held  Basic metabolic panel  Tomorrow morning,   R     Signed and Held   Signed and Held  CBC  Tomorrow morning,   R     Signed and Held          Vitals/Pain Today's Vitals   09/11/19 2145 09/11/19 2207 09/11/19 2211 09/12/19 0032  BP: (!) 137/97 (!) 145/88  133/78  Pulse: 80 70  63  Resp: 17 14  18   Temp:      SpO2: 97% 92%  96%  Weight:      Height:      PainSc:   6      Isolation Precautions No active isolations  Medications Medications  potassium chloride 10 mEq in 100 mL IVPB (10 mEq Intravenous New Bag/Given 09/12/19 0035)  pantoprazole (PROTONIX) injection 40 mg (has no administration in time range)  sodium chloride 0.9 % bolus 1,000 mL (0 mLs Intravenous Stopped 09/12/19 0039)  HYDROmorphone (DILAUDID) injection 1 mg (1 mg Intravenous Given 09/11/19 2141)  haloperidol lactate (HALDOL) injection 2 mg (2 mg Intravenous Given 09/11/19 2139)  iohexol (OMNIPAQUE) 350 MG/ML injection 100 mL (100 mLs Intravenous Contrast Given 09/11/19 2154)    Mobility walks Low fall risk   Focused Assessments Cardiac Assessment Handoff:  Cardiac Rhythm: Normal sinus rhythm No results found for: CKTOTAL, CKMB, CKMBINDEX, TROPONINI No  results found for: DDIMER Does the Patient currently have chest pain? No      R Recommendations: See Admitting Provider Note  Report given to:   Additional Notes:

## 2019-09-30 ENCOUNTER — Telehealth: Payer: Self-pay

## 2019-09-30 NOTE — Telephone Encounter (Signed)
Copied from [place] [id number]. Topic: General - Other >> Sep 30, 2019  3:00 PM Mcneil, Ja-Kwan wrote: Reason for CRM: Pt stated he is experiencing gerd and he would like to know if he can increase the omeprazole (PRILOSEC) 20 MG capsule.Pt stated he does not have insurance so he can not come in for an appt. Pt requests call back

## 2019-10-01 NOTE — Telephone Encounter (Signed)
Please find out what specific symptoms he is having. Thanks.

## 2019-10-01 NOTE — Telephone Encounter (Signed)
lmtcb ok for pec to ask patient what symptoms he is having that has him feeling he needs a increase in the dosage of omeprazole.  Nina,cma

## 2019-10-12 NOTE — Telephone Encounter (Signed)
Could never reach the patient.  Council [place],cma

## 2020-07-04 ENCOUNTER — Other Ambulatory Visit: Payer: Self-pay

## 2020-07-04 DIAGNOSIS — Z7982 Long term (current) use of aspirin: Secondary | ICD-10-CM | POA: Insufficient documentation

## 2020-07-04 DIAGNOSIS — K219 Gastro-esophageal reflux disease without esophagitis: Secondary | ICD-10-CM | POA: Insufficient documentation

## 2020-07-04 DIAGNOSIS — R1013 Epigastric pain: Secondary | ICD-10-CM | POA: Insufficient documentation

## 2020-07-04 DIAGNOSIS — R112 Nausea with vomiting, unspecified: Secondary | ICD-10-CM | POA: Insufficient documentation

## 2020-07-04 DIAGNOSIS — F17210 Nicotine dependence, cigarettes, uncomplicated: Secondary | ICD-10-CM | POA: Insufficient documentation

## 2020-07-04 DIAGNOSIS — Z20822 Contact with and (suspected) exposure to covid-19: Secondary | ICD-10-CM | POA: Insufficient documentation

## 2020-07-04 LAB — CBC
HCT: 50.8 % (ref 39.0–52.0)
Hemoglobin: 19 g/dL — ABNORMAL HIGH (ref 13.0–17.0)
MCH: 31.6 pg (ref 26.0–34.0)
MCHC: 37.4 g/dL — ABNORMAL HIGH (ref 30.0–36.0)
MCV: 84.5 fL (ref 80.0–100.0)
Platelets: 219 K/uL (ref 150–400)
RBC: 6.01 MIL/uL — ABNORMAL HIGH (ref 4.22–5.81)
RDW: 11.7 % (ref 11.5–15.5)
WBC: 12 K/uL — ABNORMAL HIGH (ref 4.0–10.5)
nRBC: 0 % (ref 0.0–0.2)

## 2020-07-04 LAB — COMPREHENSIVE METABOLIC PANEL
ALT: 49 U/L — ABNORMAL HIGH (ref 0–44)
AST: 30 U/L (ref 15–41)
Albumin: 5 g/dL (ref 3.5–5.0)
Alkaline Phosphatase: 30 U/L — ABNORMAL LOW (ref 38–126)
Anion gap: 18 — ABNORMAL HIGH (ref 5–15)
BUN: 12 mg/dL (ref 6–20)
CO2: 22 mmol/L (ref 22–32)
Calcium: 10.2 mg/dL (ref 8.9–10.3)
Chloride: 91 mmol/L — ABNORMAL LOW (ref 98–111)
Creatinine, Ser: 1.19 mg/dL (ref 0.61–1.24)
GFR calc Af Amer: >60 mL/min (ref >60)
GFR calc non Af Amer: >60 mL/min (ref >60)
Glucose, Bld: 152 mg/dL — ABNORMAL HIGH (ref 70–99)
Potassium: 4.3 mmol/L (ref 3.5–5.1)
Sodium: 131 mmol/L — ABNORMAL LOW (ref 135–145)
Total Bilirubin: 2.3 mg/dL — ABNORMAL HIGH (ref 0.3–1.2)
Total Protein: 8.1 g/dL (ref 6.5–8.1)

## 2020-07-04 LAB — LIPASE, BLOOD: Lipase: 27 U/L (ref 11–51)

## 2020-07-04 NOTE — ED Triage Notes (Addendum)
Reports severe "GERD attack" since Saturday. Has been drinking water, no food since Saturday. Dry heaving and stomach acid when throwing up.  Belching/hiccups in triage. Offered protocol zofran, states he took his prescribed Zofran PTA

## 2020-07-05 ENCOUNTER — Emergency Department
Admission: EM | Admit: 2020-07-05 | Discharge: 2020-07-05 | Disposition: A | Discharge status: Home / Self Care | Payer: Self-pay | Attending: Emergency Medicine | Admitting: Emergency Medicine

## 2020-07-05 ENCOUNTER — Emergency Department: Payer: Self-pay

## 2020-07-05 DIAGNOSIS — R112 Nausea with vomiting, unspecified: Secondary | ICD-10-CM

## 2020-07-05 DIAGNOSIS — R1013 Epigastric pain: Secondary | ICD-10-CM

## 2020-07-05 HISTORY — DX: Gastro-esophageal reflux disease without esophagitis: K21.9

## 2020-07-05 LAB — SARS CORONAVIRUS 2 BY RT PCR (HOSPITAL ORDER, PERFORMED IN ~~LOC~~ HOSPITAL LAB): SARS Coronavirus 2: NEGATIVE

## 2020-07-05 LAB — URINALYSIS, COMPLETE (UACMP) WITH MICROSCOPIC
Bacteria, UA: NONE SEEN
Bilirubin Urine: NEGATIVE
Glucose, UA: NEGATIVE mg/dL
Hgb urine dipstick: NEGATIVE
Ketones, ur: NEGATIVE mg/dL
Leukocytes,Ua: NEGATIVE
Nitrite: NEGATIVE
Protein, ur: NEGATIVE mg/dL
Specific Gravity, Urine: 1.005 (ref 1.005–1.030)
Squamous Epithelial / HPF: NONE SEEN (ref 0–5)
WBC, UA: NONE SEEN WBC/hpf (ref 0–5)
pH: 8 (ref 5.0–8.0)

## 2020-07-05 LAB — ETHANOL: Alcohol, Ethyl (B): <10 mg/dL (ref <10)

## 2020-07-05 MED ORDER — DROPERIDOL 2.5 MG/ML IJ SOLN
2.5000 mg | Freq: Once | INTRAMUSCULAR | Status: AC
  Administered 2020-07-05: 2.5 mg via INTRAVENOUS
  Filled 2020-07-05: qty 2

## 2020-07-05 MED ORDER — DEXTROSE-NACL 5-0.45 % IV SOLN: Freq: Once | INTRAVENOUS | Status: AC

## 2020-07-05 MED ORDER — DIPHENHYDRAMINE HCL 50 MG/ML IJ SOLN
25.0000 mg | Freq: Once | INTRAMUSCULAR | Status: AC
  Administered 2020-07-05: 25 mg via INTRAVENOUS
  Filled 2020-07-05: qty 1

## 2020-07-05 MED ORDER — FAMOTIDINE IN NACL 20-0.9 MG/50ML-% IV SOLN
20.0000 mg | Freq: Once | INTRAVENOUS | Status: AC
  Administered 2020-07-05: 20 mg via INTRAVENOUS
  Filled 2020-07-05: qty 50

## 2020-07-05 NOTE — ED Notes (Addendum)
Dr. Beather Arbour at the bedside to discuss CT results. IV medication infusing at this time.

## 2020-07-05 NOTE — ED Provider Notes (Signed)
[facility] Emergency Department Provider Note   ____________________________________________   First MD Initiated Contact with Patient 07/05/20 0128     (approximate)  I have reviewed the triage vital signs and the nursing notes.   HISTORY  Chief Complaint Gastroesophageal Reflux and Nausea    HPI [facility] Malinoski is a 34 y.o. male who presents to the ED from home with a chief complaint of abdominal pain, nausea and vomiting.  Reports severe "GERD attack" x3 days.  Last drink 1/2-year 3 days ago.  Smoked marijuana 2 days ago.  Similar symptoms previously with hospitalization in September 2020.  Has also been diagnosed with cannabinoid hyper emesis syndrome.  Denies fever, chills, chest pain, shortness of breath, dysuria or diarrhea.  Feels constipated and has not had a BM in 3 days.  Denies recent travel, trauma, camping or antibiotic use.      Past Medical History:  Diagnosis Date  . Elevated blood pressure reading   . Fluttering heart    SAW DR Rockey Situ ON 07-04-17-EKG DONE AND WAS WNL-PT TO F/U IF NEEDED  . GERD (gastroesophageal reflux disease)   . Hemorrhoids   . Pneumonia 2014    Patient Active Problem List   Diagnosis Date Noted  . Intractable nausea and vomiting 09/11/2019  . Chest pain 09/11/2019  . GERD (gastroesophageal reflux disease) 08/25/2019  . Elevated BP without diagnosis of hypertension 08/25/2019  . Tibia fracture 08/16/2019  . Smoker 07/04/2017  . Hemorrhoids 06/06/2017  . Palpitations 06/06/2017  . 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 Nonintractable headache 06/06/2017    Past Surgical History:  Procedure Laterality Date  . HEMORRHOID SURGERY N/A 09/23/2017   Procedure: HEMORRHOIDECTOMY;  Surgeon: Christene Lye, MD;  Location: [facility] ORS;  Service: General;  Laterality: N/A;  . RECTAL EXAM UNDER ANESTHESIA  09/23/2017   Procedure: RECTAL EXAM UNDER ANESTHESIA;  Surgeon: Christene Lye, MD;  Location: [facility] ORS;   Service: General;;  . TIBIA IM NAIL INSERTION Right 08/17/2019   Procedure: INTRAMEDULLARY (IM) NAIL TIBIAL;  Surgeon: Leim Fabry, MD;  Location: [facility] ORS;  Service: Orthopedics;  Laterality: Right;  . WISDOM TOOTH EXTRACTION      Prior to Admission medications   Medication Sig Start Date End Date Taking? Authorizing Provider  aspirin EC 325 MG EC tablet Take 1 tablet (325 mg total) by mouth daily. 08/18/19   Reche Dixon, PA-C  omeprazole (PRILOSEC) 20 MG capsule Take 1 capsule (20 mg total) by mouth daily. 09/12/19 09/11/20  Gouru, Illene Silver, MD  ondansetron (ZOFRAN ODT) 4 MG disintegrating tablet Take 1 tablet (4 mg total) by mouth every 8 (eight) hours as needed. 09/12/19   Gouru, Illene Silver, MD  traMADol (ULTRAM) 50 MG tablet Take 1 tablet (50 mg total) by mouth every 6 (six) hours as needed for moderate pain. 08/18/19   Reche Dixon, PA-C    Allergies Patient has no known allergies.  Family History  Problem Relation Age of Onset  . Diabetes Maternal Grandmother   . Heart disease Maternal Grandfather     Social History Social History   Tobacco Use  . Smoking status: Current Every Day Smoker    Packs/day: 0.25    Types: Cigarettes  . Smokeless tobacco: Never Used  . Tobacco comment: A pack will last him a week.   Substance Use Topics  . Alcohol use: Yes    Comment: 2-3 BEERS DAILY  . Drug use: Yes    Types: Marijuana    Comment: DAILY  Review of Systems  Constitutional: No fever/chills Eyes: No visual changes. ENT: No sore throat. Cardiovascular: Denies chest pain. Respiratory: Denies shortness of breath. Gastrointestinal: Positive for abdominal pain, nausea and vomiting.  No diarrhea.  Positive for constipation. Genitourinary: Negative for dysuria. Musculoskeletal: Negative for back pain. Skin: Negative for rash. Neurological: Negative for headaches, focal weakness or numbness.   ____________________________________________   PHYSICAL EXAM:  VITAL SIGNS: ED Triage  Vitals  Enc Vitals Group     BP 07/04/20 2032 (!) 179/108     Pulse Rate 07/04/20 2032 78     Resp 07/04/20 2032 18     Temp 07/04/20 2032 97.9 F (36.6 C)     Temp Source 07/04/20 2032 Oral     SpO2 07/04/20 2032 100 %     Weight 07/04/20 2033 190 lb (86.2 kg)     Height 07/04/20 2033 6\' 1"  (1.854 m)     Head Circumference --      Peak Flow --      Pain Score 07/04/20 2033 10     Pain Loc --      Pain Edu? --      Excl. in [place]? --     Constitutional: Alert and oriented.  Tearful appearing and in moderate acute distress. Eyes: Conjunctivae are normal. PERRL. EOMI. Head: Atraumatic. Nose: No congestion/rhinnorhea. Mouth/Throat: Mucous membranes are mildly dry.   Neck: No stridor.   Cardiovascular: Normal rate, regular rhythm. Grossly normal heart sounds.  Good peripheral circulation. Respiratory: Normal respiratory effort.  No retractions. Lungs CTAB. Gastrointestinal: Soft with generalized tenderness to palpation without rebound or guarding. No distention. No abdominal bruits. No CVA tenderness. Musculoskeletal: No lower extremity tenderness nor edema.  No joint effusions. Neurologic:  Normal speech and language. No gross focal neurologic deficits are appreciated.  Skin:  Skin is warm, dry and intact. No rash noted. Psychiatric: Mood and affect are normal. Speech and behavior are normal.  ____________________________________________   LABS (all labs ordered are listed, but only abnormal results are displayed)  Labs Reviewed  COMPREHENSIVE METABOLIC PANEL - Abnormal; Notable for the following components:      Result Value   Sodium 131 (*)    Chloride 91 (*)    Glucose, Bld 152 (*)    ALT 49 (*)    Alkaline Phosphatase 30 (*)    Total Bilirubin 2.3 (*)    Anion gap 18 (*)    All other components within normal limits  CBC - Abnormal; Notable for the following components:   WBC 12.0 (*)    RBC 6.01 (*)    Hemoglobin 19.0 (*)    MCHC 37.4 (*)    All other components  within normal limits  URINALYSIS, COMPLETE (UACMP) WITH MICROSCOPIC - Abnormal; Notable for the following components:   Color, Urine YELLOW (*)    APPearance CLOUDY (*)    All other components within normal limits  SARS CORONAVIRUS 2 BY RT PCR (HOSPITAL ORDER, [place] [facility])  LIPASE, BLOOD  ETHANOL   ____________________________________________  EKG  ED ECG REPORT I, Salil Raineri J, the attending physician, personally viewed and interpreted this ECG.   Date: 07/05/2020  EKG Time: 2034  Rate: 66  Rhythm: normal EKG, normal sinus rhythm  Axis: Normal  Intervals:none  ST&T Change: Nonspecific  ____________________________________________  RADIOLOGY  ED MD interpretation: No acute cardiopulmonary process; possible diarrhea on CT scan  Official radiology report(s): CT Abdomen Pelvis Wo Contrast  Result Date: 07/05/2020 CLINICAL DATA:  34 year old male with nausea and vomiting for several days. EXAM: CT ABDOMEN AND PELVIS WITHOUT CONTRAST TECHNIQUE: Multidetector CT imaging of the abdomen and pelvis was performed following the standard protocol without IV contrast. COMPARISON:  CT Chest, Abdomen and Pelvis 09/11/2019 FINDINGS: Lower chest: Negative, larger lung volumes. Hepatobiliary: Probable hepatic steatosis. Negative noncontrast gallbladder. Pancreas: Negative. Spleen: Negative. Adrenals/Urinary Tract: Normal adrenal glands. Negative noncontrast kidneys and ureters. Unremarkable urinary bladder. Stomach/Bowel: Fluid, possibly dilute oral contrast, throughout the large bowel to the rectum. Mildly redundant sigmoid colon. No dilated or thick-walled colon. Normal retrocecal appendix containing contrast on series 2, image 65. Negative terminal ileum and no dilated small bowel. Small volume of fluid in the stomach. Decompressed duodenum. No free air. No free fluid. No mesenteric stranding identified. Vascular/Lymphatic: Normal caliber aorta. Vascular patency is not  evaluated in the absence of IV contrast. No lymphadenopathy. There is a small partially calcified mesenteric nodule or lymph node in the left lower quadrant on series 5, image 32 which is unchanged and benign/postinflammatory. Reproductive: Negative aside from small fat containing left inguinal hernia. Other: No pelvic free fluid. Musculoskeletal: Negative. IMPRESSION: 1. Fluid, possibly dilute oral contrast, throughout the large bowel to the rectum. Diarrhea is possible but there is no bowel obstruction or inflammation identified. Normal appendix. 2. Probable hepatic steatosis. Small fat containing left inguinal hernia. Electronically Signed   By: Genevie Ann M.D.   On: 07/05/2020 02:15   DG Chest 1 View  Result Date: 07/05/2020 CLINICAL DATA:  34 year old male with vomiting. EXAM: CHEST  1 VIEW COMPARISON:  Chest radiograph dated 09/11/2019. FINDINGS: Minimal right lung base nodularity, likely prominent vasculature. No focal consolidation, pleural effusion, pneumothorax. The cardiac silhouette is within limits. No acute osseous pathology. IMPRESSION: No active disease. Electronically Signed   By: Anner Crete M.D.   On: 07/05/2020 02:04    ____________________________________________   PROCEDURES  Procedure(s) performed (including Critical Care):  Procedures   ____________________________________________   INITIAL IMPRESSION / ASSESSMENT AND PLAN / ED COURSE  As part of my medical decision making, I reviewed the following data within the [place] History obtained from family, Nursing notes reviewed and incorporated, Labs reviewed, EKG interpreted, Old chart reviewed, Radiograph reviewed and Notes from prior ED visits     [place] was evaluated in Emergency Department on 07/05/2020 for the symptoms described in the history of present illness. He was evaluated in the context of the global COVID-19 pandemic, which necessitated consideration that the patient might be  at risk for infection with the SARS-CoV-2 virus that causes COVID-19. Institutional protocols and algorithms that pertain to the evaluation of patients at risk for COVID-19 are in a state of rapid change based on information released by regulatory bodies including the CDC and federal and state organizations. These policies and algorithms were followed during the patient's care in the ED.    34 year old male presenting with abdominal pain, nausea and vomiting. Differential diagnosis includes, but is not limited to, biliary disease (biliary colic, acute cholecystitis, cholangitis, choledocholithiasis, etc), intrathoracic causes for epigastric abdominal pain including ACS, gastritis, duodenitis, pancreatitis, small bowel or large bowel obstruction, abdominal aortic aneurysm, hernia, and ulcer(s).  Laboratory results remarkable for hyponatremia, elevated bilirubin and anion gap.  History of alcoholic ketoacidosis and suspect the same now.  Will initiate IV fluid resuscitation, IV droperidol with Benadryl for nausea/vomiting.  CT abdomen/pelvis without contrast to evaluate intra-abdominal etiology of patient's symptoms.   Clinical Course as of Jul 05 630  Tue Jul 05, 2020  7841 Updated patient and mother on CT imaging result.  Patient just received his medications and is resting in no acute distress.   [JS]  2820 Patient feeling significantly better.  Able to tolerate ice chips without emesis.  Has Zofran to use at home.  Strict return precautions given.  Patient and mother verbalized understanding and agree with plan of care.   [JS]    Clinical Course User Index [JS] Paulette Blanch, MD     ____________________________________________   FINAL CLINICAL IMPRESSION(S) / ED DIAGNOSES  Final diagnoses:  Epigastric pain  Nausea and vomiting, intractability of vomiting not specified, unspecified vomiting type     ED Discharge Orders    None       Note:  This document was prepared using Dragon  voice recognition software and may include unintentional dictation errors.   Paulette Blanch, MD 07/05/20 [id number]

## 2020-07-05 NOTE — ED Notes (Signed)
Dr. Sung at the bedside °

## 2020-07-05 NOTE — ED Notes (Signed)
Pt ambulated to restroom with steady gait. States he is feeling much better. IV fluids infusing without difficulty.

## 2020-07-05 NOTE — ED Notes (Signed)
Pt to CT

## 2020-07-05 NOTE — Discharge Instructions (Signed)
Take your Zofran as needed for nausea/vomiting.  Clear liquids x12 hours, then bland diet x3 days, then slowly advance diet as tolerated.  Return to the ER for worsening symptoms, persistent vomiting, difficulty breathing or other concerns.

## 2021-04-03 ENCOUNTER — Telehealth: Payer: Self-pay | Admitting: Family Medicine

## 2021-04-03 NOTE — Telephone Encounter (Signed)
Noted  

## 2021-04-03 NOTE — Telephone Encounter (Signed)
[facility] called to schedule a HFU appt  Pt was seen for Nausea and vomiting  VV appt scheduled for 4/19

## 2021-04-06 ENCOUNTER — Telehealth: Payer: Self-pay

## 2021-04-06 NOTE — Telephone Encounter (Signed)
Transition Care Management Unsuccessful Follow-up Telephone Call  Date of discharge and from where:  04/04/21 from [facility]  Attempts:  1st Attempt  Reason for unsuccessful TCM follow-up call:  No answer/busy  HFU scheduled 04/11/21. Will follow.

## 2021-04-10 NOTE — Telephone Encounter (Signed)
Transition Care Management Follow-up Telephone Call  Date of discharge and from where: 04/04/21 from [facility]  How have you been since you were released from the hospital? Patient stated, "I have intermittent nausea continued. No longer taking zofran because it makes it worse. Vomited once today. No prior vomiting since discharge. Drinking fluids as much as possible." Denies abd pain, fever, blood in stool and all other symptoms. Regular BM.   Any questions or concerns? Yes  Items Reviewed:  Did the pt receive and understand the discharge instructions provided? Yes   Medications obtained and verified? Yes   Other? No   Any new allergies since your discharge? No   Dietary orders reviewed? Yes  Do you have support at home? Yes   Home Care and Equipment/Supplies: Were home health services ordered? No  Functional Questionnaire: (I = Independent and D = Dependent) ADLs: I  Follow up appointments reviewed:   PCP Hospital f/u appt confirmed? Yes  Scheduled to see Dr. Caryl Bis on 04/11/21.  Are transportation arrangements needed? No   If their condition worsens, is the pt aware to call PCP or go to the Emergency Dept.? Yes  Was the patient provided with contact information for the PCP's office or ED? Yes  Was to pt encouraged to call back with questions or concerns? Yes

## 2021-04-11 ENCOUNTER — Encounter: Payer: Self-pay | Admitting: Family Medicine

## 2021-04-11 ENCOUNTER — Telehealth (INDEPENDENT_AMBULATORY_CARE_PROVIDER_SITE_OTHER): Payer: Self-pay | Admitting: Family Medicine

## 2021-04-11 ENCOUNTER — Other Ambulatory Visit: Payer: Self-pay

## 2021-04-11 DIAGNOSIS — K219 Gastro-esophageal reflux disease without esophagitis: Secondary | ICD-10-CM

## 2021-04-11 DIAGNOSIS — F1290 Cannabis use, unspecified, uncomplicated: Secondary | ICD-10-CM

## 2021-04-11 DIAGNOSIS — R112 Nausea with vomiting, unspecified: Secondary | ICD-10-CM

## 2021-04-11 MED ORDER — ONDANSETRON HCL 8 MG PO TABS: 8.0000 mg | ORAL_TABLET | Freq: Three times a day (TID) | ORAL | 0 refills | Status: AC | PRN

## 2021-04-11 NOTE — Progress Notes (Signed)
Virtual Visit via video Note  This visit type was conducted due to national recommendations for restrictions regarding the COVID-19 pandemic (e.g. social distancing).  This format is felt to be most appropriate for this patient at this time.  All issues noted in this document were discussed and addressed.  No physical exam was performed (except for noted visual exam findings with Video Visits).   I connected with Cory Ward today at 11:30 AM EDT by a video enabled telemedicine application and verified that I am speaking with the correct person using two identifiers. Location patient: car Location provider: work  Persons participating in the virtual visit: patient, provider  I discussed the limitations, risks, security and privacy concerns of performing an evaluation and management service by telephone and the availability of in person appointments. I also discussed with the patient that there may be a patient responsible charge related to this service. The patient expressed understanding and agreed to proceed.  Reason for visit: hospital follow-up  HPI: Intractable nausea and vomiting: The patient was hospitalized for this in early April.  Notes he had a week of symptoms leading into this with nausea and vomiting.  He previously had an EGD that revealed gastritis that was felt to be related to alcohol intake.  Prior to his most recent hospitalization he notes he had cut down quite a bit though notes he has not had any alcohol in the last 2 weeks since his hospitalization.  He does smoke marijuana 3-4 times a day.  He has been taking his omeprazole 40 mg once daily.  He notes that Zofran has not been terribly beneficial.  He has follow-up scheduled for later this month with GI.  He has stopped his over-the-counter supplementation.  He does note continued occasional symptoms particularly with eating.  He does occasionally vomit clear mucus though this morning there was a slight brown tint to the  mucus and there were brown chunks with a slight red hue in his vomit.  He is unsure if it was blood.  He notes no coffee-ground emesis.  He did have a negative H. pylori test.  ROS: See pertinent positives and negatives per HPI.  Past Medical History:  Diagnosis Date  . Elevated blood pressure reading   . Fluttering heart    SAW DR Rockey Situ ON 07-04-17-EKG DONE AND WAS WNL-PT TO F/U IF NEEDED  . GERD (gastroesophageal reflux disease)   . Hemorrhoids   . Pneumonia 2014    Past Surgical History:  Procedure Laterality Date  . HEMORRHOID SURGERY N/A 09/23/2017   Procedure: HEMORRHOIDECTOMY;  Surgeon: Christene Lye, MD;  Location: [facility] ORS;  Service: General;  Laterality: N/A;  . RECTAL EXAM UNDER ANESTHESIA  09/23/2017   Procedure: RECTAL EXAM UNDER ANESTHESIA;  Surgeon: Christene Lye, MD;  Location: [facility] ORS;  Service: General;;  . TIBIA IM NAIL INSERTION Right 08/17/2019   Procedure: INTRAMEDULLARY (IM) NAIL TIBIAL;  Surgeon: Leim Fabry, MD;  Location: [facility] ORS;  Service: Orthopedics;  Laterality: Right;  . WISDOM TOOTH EXTRACTION      Family History  Problem Relation Age of Onset  . Diabetes Maternal Grandmother   . Heart disease Maternal Grandfather     SOCIAL HX: Smoker   Current Outpatient Medications:  .  aspirin EC 325 MG EC tablet, Take 1 tablet (325 mg total) by mouth daily., Disp: 30 tablet, Rfl: 0 .  ondansetron (ZOFRAN) 8 MG tablet, Take 1 tablet (8 mg total) by mouth every 8 (eight) hours  as needed for nausea or vomiting., Disp: 20 tablet, Rfl: 0 .  traMADol (ULTRAM) 50 MG tablet, Take 1 tablet (50 mg total) by mouth every 6 (six) hours as needed for moderate pain., Disp: 30 tablet, Rfl: 1 .  omeprazole (PRILOSEC) 20 MG capsule, Take 1 capsule (20 mg total) by mouth daily., Disp: 30 capsule, Rfl: 1  EXAM:  VITALS per patient if applicable:  GENERAL: alert, oriented, appears well and in no acute distress  HEENT: atraumatic, conjunttiva clear, no  obvious abnormalities on inspection of external nose and ears  NECK: normal movements of the head and neck  LUNGS: on inspection no signs of respiratory distress, breathing rate appears normal, no obvious gross SOB, gasping or wheezing  CV: no obvious cyanosis  MS: moves all visible extremities without noticeable abnormality  PSYCH/NEURO: pleasant and cooperative, no obvious depression or anxiety, speech and thought processing grossly intact  ASSESSMENT AND PLAN:  Discussed the following assessment and plan:  Problem List Items Addressed This Visit    GERD (gastroesophageal reflux disease)    Continue omeprazole 40 mg once daily.      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  Discussed hyperemesis related to marijuana use and recommended that he quit marijuana use and progressively cut down in the least to help with the symptoms.  He will continue his omeprazole 40 mg once daily.  He will keep his appointment with GI.  We will send in a higher dose of the Zofran to see if that is more beneficial.      Relevant Medications   ondansetron (ZOFRAN) 8 MG tablet   Marijuana use    Chronic issue.  Discussed marijuana's role in his nausea and vomiting issues.  Advised cessation.          I discussed the assessment and treatment plan with the patient. The patient was provided an opportunity to ask questions and all were answered. The patient agreed with the plan and demonstrated an understanding of the instructions.   The patient was advised to call back or seek an in-person evaluation if the symptoms worsen or if the condition fails to improve as anticipated.   Tommi Rumps, MD

## 2021-04-11 NOTE — Assessment & Plan Note (Signed)
Chronic issue.  Discussed marijuana's role in his nausea and vomiting issues.  Advised cessation.

## 2021-04-11 NOTE — Telephone Encounter (Signed)
Reviewed

## 2021-04-11 NOTE — Assessment & Plan Note (Signed)
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  Discussed hyperemesis related to marijuana use and recommended that he quit marijuana use and progressively cut down in the least to help with the symptoms.  He will continue his omeprazole 40 mg once daily.  He will keep his appointment with GI.  We will send in a higher dose of the Zofran to see if that is more beneficial.

## 2021-04-11 NOTE — Assessment & Plan Note (Signed)
Continue omeprazole 40 mg once daily.

## 2021-10-11 ENCOUNTER — Emergency Department (HOSPITAL_COMMUNITY)
Admission: EM | Admit: 2021-10-11 | Discharge: 2021-10-11 | Disposition: A | Discharge status: Home / Self Care | Payer: No Typology Code available for payment source | Attending: Emergency Medicine | Admitting: Emergency Medicine

## 2021-10-11 ENCOUNTER — Emergency Department (HOSPITAL_COMMUNITY): Payer: No Typology Code available for payment source

## 2021-10-11 ENCOUNTER — Other Ambulatory Visit: Payer: Self-pay

## 2021-10-11 DIAGNOSIS — S0101XA Laceration without foreign body of scalp, initial encounter: Secondary | ICD-10-CM | POA: Insufficient documentation

## 2021-10-11 DIAGNOSIS — Y92410 Unspecified street and highway as the place of occurrence of the external cause: Secondary | ICD-10-CM | POA: Diagnosis not present

## 2021-10-11 DIAGNOSIS — F17210 Nicotine dependence, cigarettes, uncomplicated: Secondary | ICD-10-CM | POA: Insufficient documentation

## 2021-10-11 DIAGNOSIS — S0181XA Laceration without foreign body of other part of head, initial encounter: Secondary | ICD-10-CM

## 2021-10-11 DIAGNOSIS — S060X9A Concussion with loss of consciousness of unspecified duration, initial encounter: Secondary | ICD-10-CM | POA: Diagnosis not present

## 2021-10-11 DIAGNOSIS — Z7982 Long term (current) use of aspirin: Secondary | ICD-10-CM | POA: Diagnosis not present

## 2021-10-11 DIAGNOSIS — Y93I9 Activity, other involving external motion: Secondary | ICD-10-CM | POA: Insufficient documentation

## 2021-10-11 DIAGNOSIS — S060XAA Concussion with loss of consciousness status unknown, initial encounter: Secondary | ICD-10-CM

## 2021-10-11 MED ORDER — ACETAMINOPHEN 500 MG PO TABS
1000.0000 mg | ORAL_TABLET | Freq: Once | ORAL | Status: AC
  Administered 2021-10-11: 1000 mg via ORAL
  Filled 2021-10-11: qty 2

## 2021-10-11 MED ORDER — LIDOCAINE-EPINEPHRINE (PF) 2 %-1:200000 IJ SOLN
20.0000 mL | Freq: Once | INTRAMUSCULAR | Status: AC
  Administered 2021-10-11: 20 mL
  Filled 2021-10-11: qty 20

## 2021-10-11 NOTE — Discharge Instructions (Signed)
You will need to have your eight (8) stitches removed in 5-7 days (10/16/21). This can be done at an urgent care or your doctor's office, and does not require returning to an ER unless you have other urgent medical concerns.   Keep your wound dry for the next 2 days.  Afterwards you can shower, do not scrub at the wound or use hair products or shampoo.  Do not apply ointments to your wound - you can leave it open to air after 48 hours.  You may be experiencing a concussion.  Most people recover within 30 days.  If you have persistent symptoms follow up with your PCP in 1-2 weeks.
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
## 2021-10-13 ENCOUNTER — Emergency Department
Admission: EM | Admit: 2021-10-13 | Discharge: 2021-10-13 | Disposition: A | Discharge status: Home / Self Care | Payer: No Typology Code available for payment source | Attending: Emergency Medicine | Admitting: Emergency Medicine

## 2021-10-13 ENCOUNTER — Telehealth: Payer: Self-pay | Admitting: Family Medicine

## 2021-10-13 ENCOUNTER — Other Ambulatory Visit: Payer: Self-pay

## 2021-10-13 ENCOUNTER — Emergency Department: Payer: No Typology Code available for payment source

## 2021-10-13 DIAGNOSIS — F17210 Nicotine dependence, cigarettes, uncomplicated: Secondary | ICD-10-CM | POA: Diagnosis not present

## 2021-10-13 DIAGNOSIS — S0990XA Unspecified injury of head, initial encounter: Secondary | ICD-10-CM | POA: Diagnosis present

## 2021-10-13 DIAGNOSIS — S060X9A Concussion with loss of consciousness of unspecified duration, initial encounter: Secondary | ICD-10-CM | POA: Insufficient documentation

## 2021-10-13 DIAGNOSIS — M25551 Pain in right hip: Secondary | ICD-10-CM | POA: Insufficient documentation

## 2021-10-13 DIAGNOSIS — Z7982 Long term (current) use of aspirin: Secondary | ICD-10-CM | POA: Insufficient documentation

## 2021-10-13 DIAGNOSIS — Y92410 Unspecified street and highway as the place of occurrence of the external cause: Secondary | ICD-10-CM | POA: Insufficient documentation

## 2021-10-13 DIAGNOSIS — S0990XD Unspecified injury of head, subsequent encounter: Secondary | ICD-10-CM

## 2021-10-13 MED ORDER — ONDANSETRON 4 MG PO TBDP: 4.0000 mg | ORAL_TABLET | Freq: Three times a day (TID) | ORAL | 0 refills | Status: AC | PRN

## 2021-10-13 MED ORDER — DIPHENHYDRAMINE HCL 50 MG/ML IJ SOLN
12.5000 mg | Freq: Once | INTRAMUSCULAR | Status: AC
  Administered 2021-10-13: 12.5 mg via INTRAVENOUS
  Filled 2021-10-13: qty 1

## 2021-10-13 MED ORDER — METOCLOPRAMIDE HCL 5 MG/ML IJ SOLN
10.0000 mg | Freq: Once | INTRAMUSCULAR | Status: AC
  Administered 2021-10-13: 10 mg via INTRAVENOUS
  Filled 2021-10-13: qty 2

## 2021-10-13 MED ORDER — CYCLOBENZAPRINE HCL 5 MG PO TABS: 5.0000 mg | ORAL_TABLET | Freq: Three times a day (TID) | ORAL | 0 refills | Status: AC | PRN

## 2021-10-13 MED ORDER — IBUPROFEN 800 MG PO TABS
800.0000 mg | ORAL_TABLET | Freq: Three times a day (TID) | ORAL | 0 refills | Status: AC | PRN
Start: 2021-10-13 — End: 2021-10-20

## 2021-10-13 NOTE — Telephone Encounter (Signed)
Noted. Thank you for taking care of this. °
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
## 2021-10-13 NOTE — Telephone Encounter (Signed)
Symptoms started worsen patient has called 911 to transport to hospital.
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
## 2021-10-13 NOTE — Discharge Instructions (Addendum)
Take Tylenol 1 g every 6-8 hours and alternate with the ibuprofen with food.  Use the Flexeril but do not drive while on this and the Zofran to help with nausea.  This will take some time for it to heal.

## 2021-10-13 NOTE — Telephone Encounter (Signed)
Providing access nurse documentation.      
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
## 2021-10-13 NOTE — ED Triage Notes (Signed)
Pt states that he cont to have head pain, top of head and behind his eyes with pressure, pt states that he is also having neck pain, right arm and back pain

## 2021-10-13 NOTE — Telephone Encounter (Signed)
Given the description of his headache being worse with coughing and bending over he does need to be seen in the emergency department.  If he absolutely declines that he should go to urgent care.  I would advised him not to wait until next week to be seen.
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
## 2021-10-17 ENCOUNTER — Ambulatory Visit (INDEPENDENT_AMBULATORY_CARE_PROVIDER_SITE_OTHER): Payer: Self-pay | Admitting: Family

## 2021-10-17 ENCOUNTER — Other Ambulatory Visit: Payer: Self-pay

## 2021-10-17 ENCOUNTER — Encounter: Payer: Self-pay | Admitting: Family

## 2021-10-17 VITALS — BP 110/68 | HR 76 | Temp 96.3°F | Ht 72.99 in | Wt 179.8 lb

## 2021-10-17 DIAGNOSIS — Z4802 Encounter for removal of sutures: Secondary | ICD-10-CM

## 2021-10-17 DIAGNOSIS — S060X0D Concussion without loss of consciousness, subsequent encounter: Secondary | ICD-10-CM
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
## 2022-08-13 IMAGING — CT CT HEAD W/O CM
4 series · 17 of 47 positions shown, 19 images · non-contrast
Comparison: 10/11/2021

CLINICAL DATA: Head injury

Altered mental status
Headache
EXAM:
CT HEAD WITHOUT CONTRAST
TECHNIQUE: Contiguous axial images were obtained from the base of the skull
through the vertex without intravenous contrast.

[Series 2: head wo · axial · 0.44mm/px · z∈[+184,+304]mm · 7 of 33 slices shown, 9 images]
[im 5/33  brain]
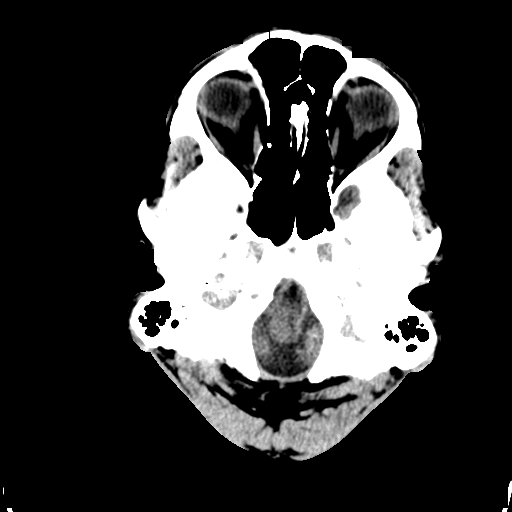
[im 5/33  bone]
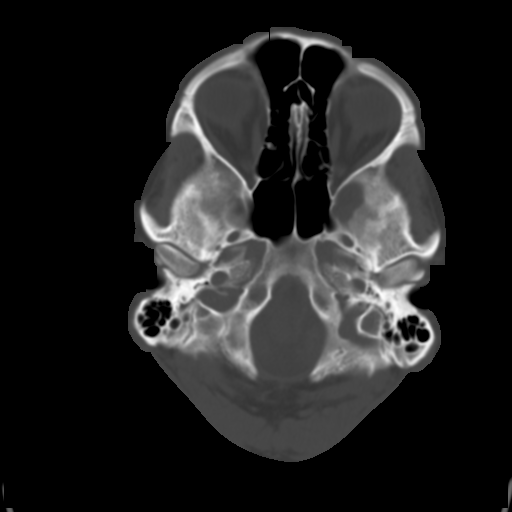
[im 9/33  brain]
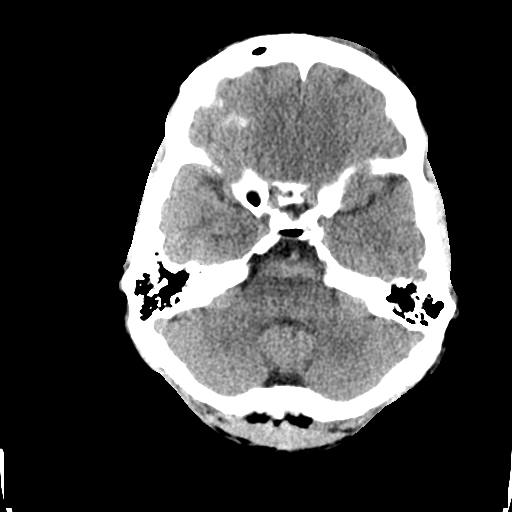
[im 13/33  brain]
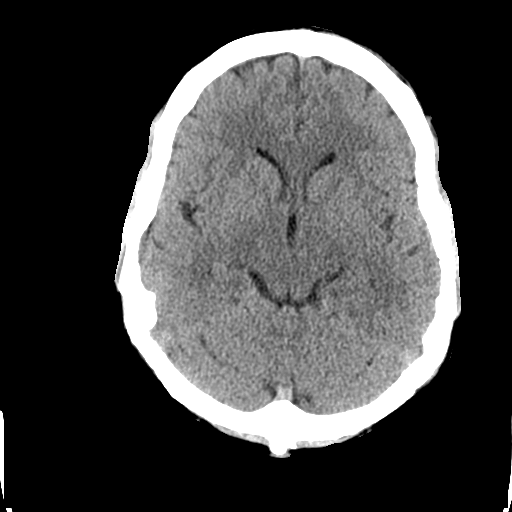
[im 17/33  brain]
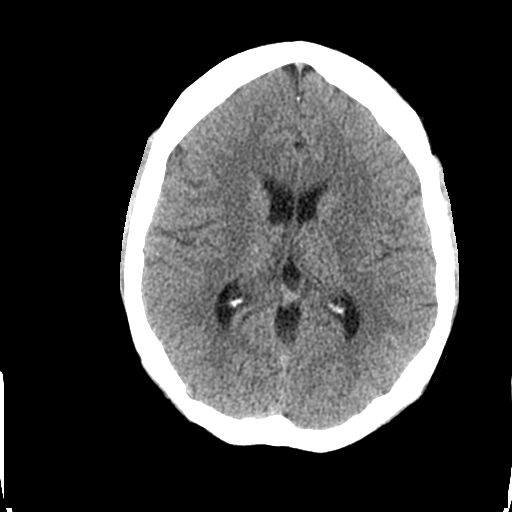
[im 21/33  brain]
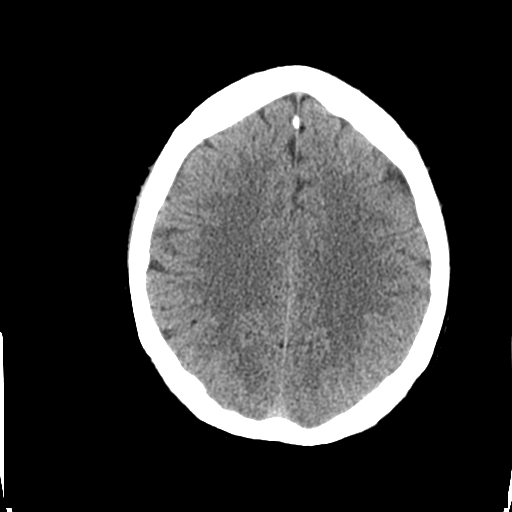
[im 21/33  bone]
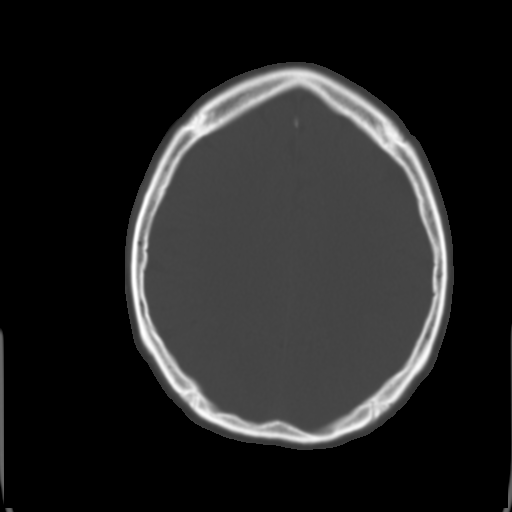
[im 25/33  brain]
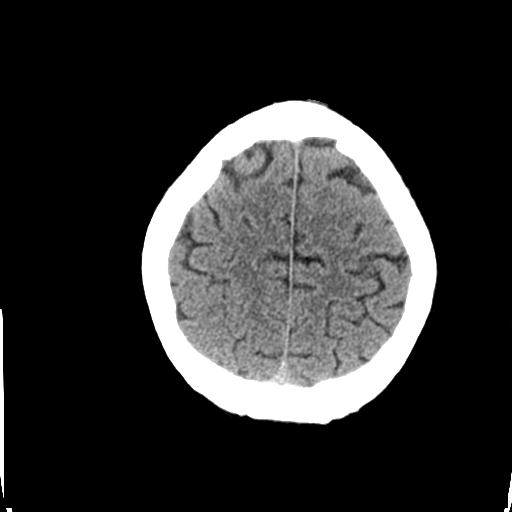
[im 29/33  brain]
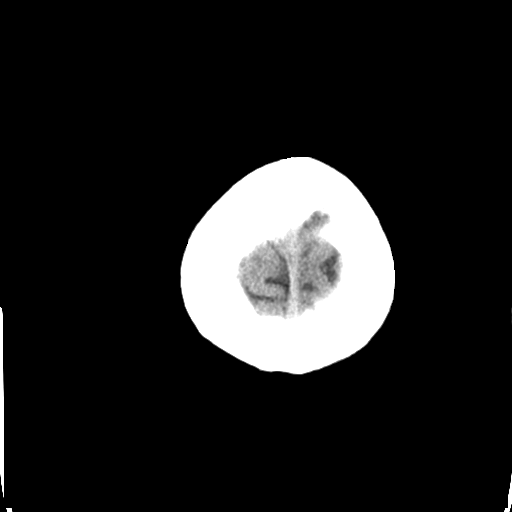

[Series 3: head bone · axial · 0.44mm/px · z∈[+180,+236]mm · 4 of 82 slices shown]
[im 9/82  bone]
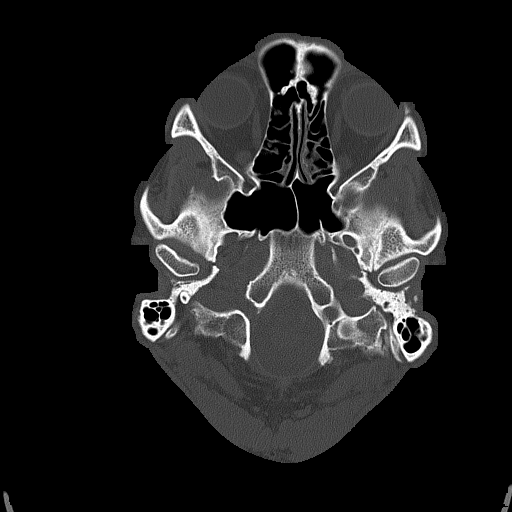
[im 17/82  bone]
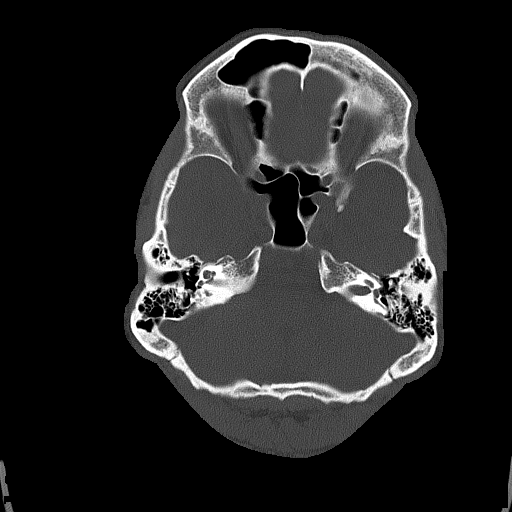
[im 25/82  bone]
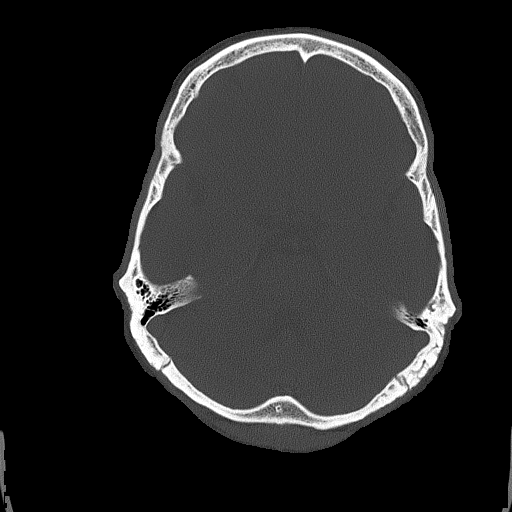
[im 37/82  bone]
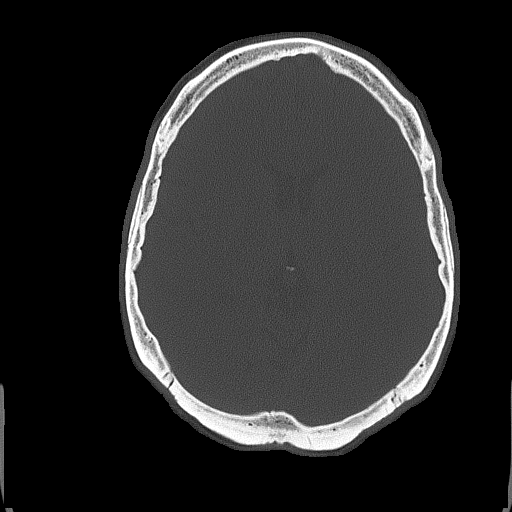

[Series 4: coronal soft tissue · coronal · 0.35mm/px · 3 of 75 slices shown]
[im 25/75  brain]
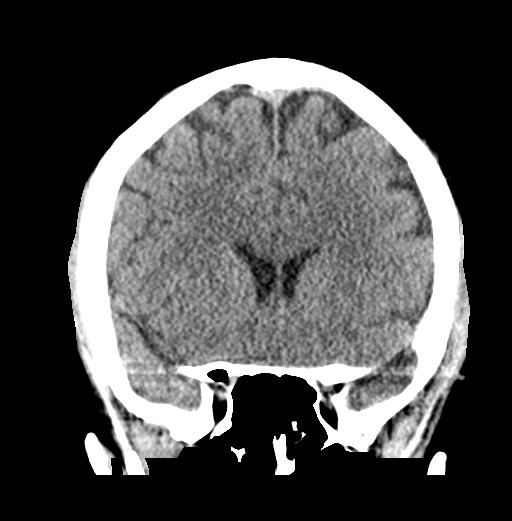
[im 33/75  brain]
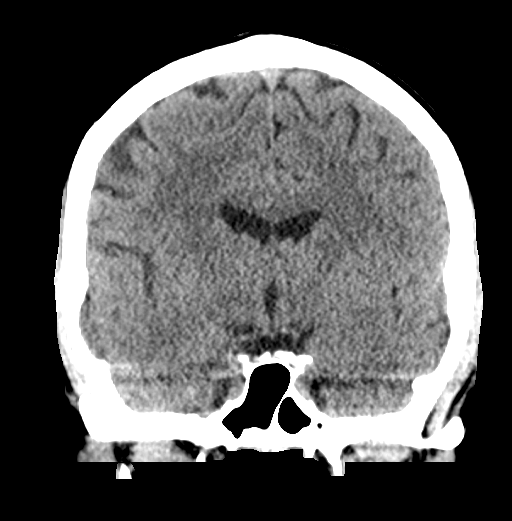
[im 42/75  brain]
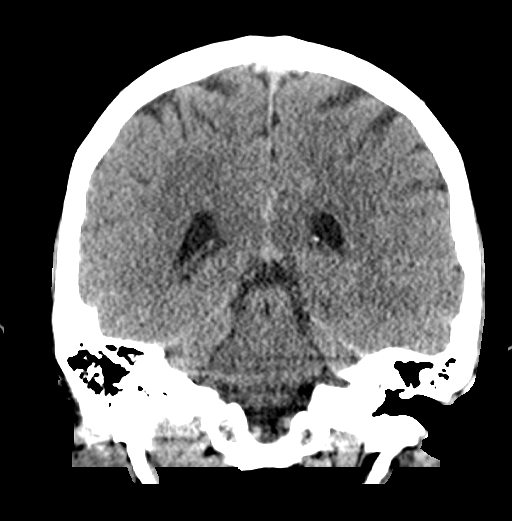

[Series 5: sagittal soft tissue · sagittal · 0.35mm/px · 3 of 82 slices shown]
[im 28/82  brain]
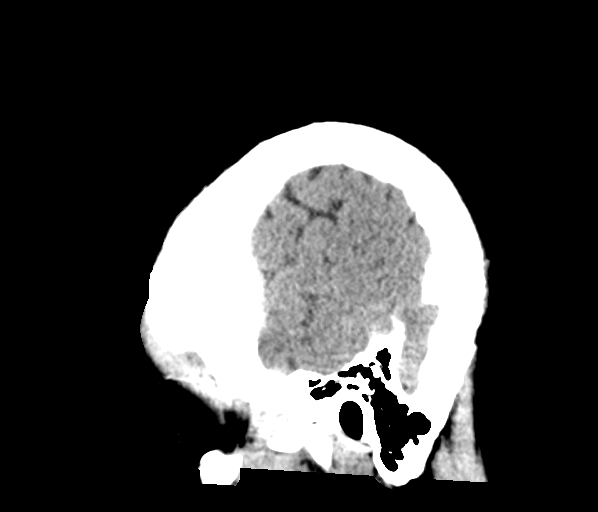
[im 41/82  brain]
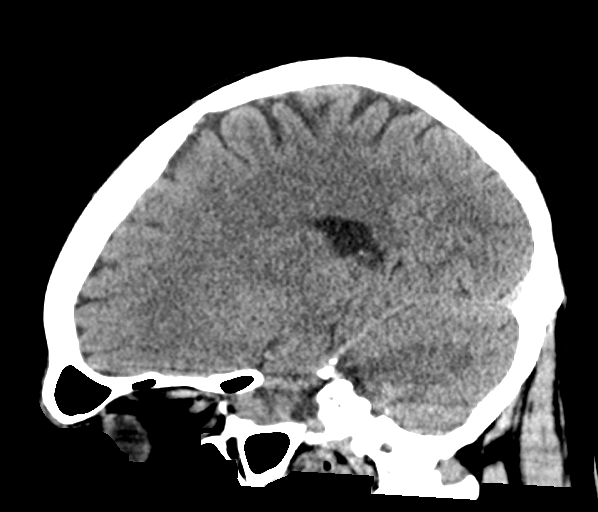
[im 55/82  brain]
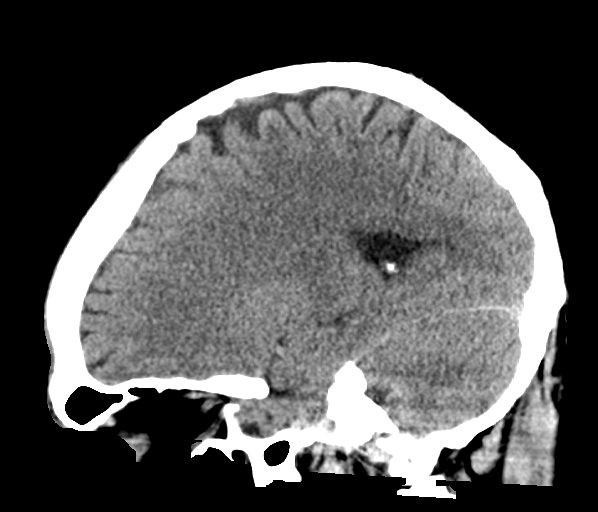

[17 of 47 positions shown; findings below may reference images not displayed]

FINDINGS: Brain: No evidence of acute infarction, hemorrhage, hydrocephalus,
extra-axial collection or mass lesion/mass effect.

Vascular: No hyperdense vessel or unexpected calcification.

Skull: Normal. Negative for fracture or focal lesion.

Sinuses/Orbits: Air-fluid level in the left maxillary sinus
suspicious for acute sinusitis.

Other: Interval improvement of laceration and hematoma of the
anterior scalp.
IMPRESSION: No acute intracranial abnormality.
